# Patient Record
Sex: Female | Born: 1995 | Race: Black or African American | Hispanic: No | Marital: Married
Health system: Midwestern US, Community
[De-identification: ages and names within clinical notes are randomized; demographics above are authoritative.]

## PROBLEM LIST (undated history)

## (undated) DIAGNOSIS — F419 Anxiety disorder, unspecified: Secondary | ICD-10-CM

## (undated) DIAGNOSIS — G43909 Migraine, unspecified, not intractable, without status migrainosus: Secondary | ICD-10-CM

## (undated) DIAGNOSIS — F410 Panic disorder [episodic paroxysmal anxiety] without agoraphobia: Secondary | ICD-10-CM

## (undated) HISTORY — PX: TUBAL LIGATION: SHX77

## (undated) HISTORY — PX: ADENOIDECTOMY: SUR15

## (undated) HISTORY — PX: WISDOM TOOTH EXTRACTION: SHX21

---
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
## 2014-11-30 ENCOUNTER — Ambulatory Visit
Admit: 2014-11-30 | Discharge: 2014-11-30 | Payer: PRIVATE HEALTH INSURANCE | Attending: Internal Medicine | Primary: Internal Medicine

## 2014-11-30 DIAGNOSIS — F410 Panic disorder [episodic paroxysmal anxiety] without agoraphobia: Secondary | ICD-10-CM

## 2014-11-30 MED ORDER — HYDROXYZINE PAMOATE 25 MG CAP
25 mg | ORAL_CAPSULE | Freq: Three times a day (TID) | ORAL | 2 refills | Status: DC | PRN
Start: 2014-11-30 — End: 2014-12-03

## 2014-11-30 MED ORDER — CITALOPRAM 10 MG TAB
10 mg | ORAL_TABLET | Freq: Every day | ORAL | 2 refills | Status: DC
Start: 2014-11-30 — End: 2014-12-03

## 2014-11-30 NOTE — Progress Notes (Signed)
History of Present Illness  Kelsey Huff is a 19 y.o. female who presents today for management of    Chief Complaint   Patient presents with   ??? Establish Care   ??? Anxiety       Patient is here to establish care. She moved from [place] one year ago.  Patient states that she was on anxiety medication (Valium ) in the past but was discontinued by her previous PCP.  Recently, she had a family issue which precipitated her anxiety. She states that this is temporary. Last week, she had an episode of SOB, chest pain and sweating. She was brought to the [facility]. She denies any depression, anhedonia, sleeping problems, appetite issues.       Past Medical History  Past Medical History   Diagnosis Date   ??? Anxiety         Surgical History  History reviewed. No pertinent past surgical history.     Current Medications  Current Outpatient Prescriptions   Medication Sig Dispense   ??? norgestimate-ethinyl estradiol (ORTHO TRI-CYCLEN LO) 0.18/0.215/0.25 mg-25 mcg tab TAKE 1 TABLET DAILY    ??? valACYclovir (VALTREX) 1 gram tablet TAKE 1 TABLET BY MOUTH DAILY.    ??? citalopram (CELEXA) 10 mg tablet Take 1 Tab by mouth daily. Indications: PANIC DISORDER 30 Tab   ??? hydrOXYzine (VISTARIL) 25 mg capsule Take 1 Cap by mouth every eight (8) hours as needed for Anxiety. 30 Cap     No current facility-administered medications for this visit.        Allergies/Drug Reactions  No Known Allergies     Family History  Family History   Problem Relation Age of Onset   ??? Heart Disease Mother    ??? No Known Problems Father    ??? Hypertension Maternal Grandmother    ??? Hypertension Maternal Grandfather         Social History  Social History     Social History   ??? Marital status: MARRIED     Spouse name: N/A   ??? Number of children: N/A   ??? Years of education: N/A     Occupational History   ??? Not on file.     Social History Main Topics   ??? Smoking status: Never Smoker   ??? Smokeless tobacco: Never Used   ??? Alcohol use No   ??? Drug use: No    ??? Sexual activity: Yes     Partners: Female, Female     Birth control/ protection: Pill     Other Topics Concern   ??? Not on file     Social History Narrative   ??? No narrative on file       Health Maintenance   Topic Date Due   ??? Hepatitis A Peds Age 63-18 (1 of 2 - Standard Series) 05/29/1995   ??? DTaP/Tdap/Td series (1 - Tdap) 05/28/2001   ??? HPV AGE 22Y-26Y (1 of 3 - Female 3 Dose Series) 05/28/2005   ??? INFLUENZA AGE 22 TO ADULT  11/19/2014       There is no immunization history on file for this patient.    Review of Systems  General ROS: negative for - chills, fatigue or fever  Psychological ROS: positive for - anxiety  Ophthalmic ROS: positive for - uses glasses  ENT ROS: negative  Allergy and Immunology ROS: negative  Respiratory ROS: no cough, shortness of breath, or wheezing  Cardiovascular ROS: no chest pain or dyspnea on exertion  Gastrointestinal ROS: no abdominal pain, change in bowel habits, or black or bloody stools  Genito-Urinary ROS: no dysuria, trouble voiding, or hematuria  Musculoskeletal ROS: negative  Neurological ROS: positive for - headaches    Physical Exam  Vital signs:   Vitals:    11/30/14 1140   BP: 109/66   Pulse: 70   Resp: 14   Temp: 98.1 ??F (36.7 ??C)   TempSrc: Oral   SpO2: 99%   Weight: 148 lb (67.1 kg)   Height: 5\' 4"  (1.626 m)       General: alert, oriented, not in distress  Head: scalp normal, atraumatic  Eyes: pupils are equal and reactive, full and intact EOM's  Ears: patent ear canal, intact tympanic membrane  Nose: normal turbinates, no congestion or discharge  Lips/Mouth: moist lips and buccal mucosa, non-enlarged tonsils, pink throat  Neck: supple, no JVD, no lymphadenopathy, non-palpable thyroid  Chest/Lungs: clear breath sounds, no wheezing or crackles  Heart: normal rate, regular rhythm, no murmur  Abdomen: soft, non-distended, non-tender, normal bowel sounds, no organomegaly, no masses  Extremities: no focal deformities, no edema  Skin: no active skin lesions     Assessment/Plan:        ICD-10-CM ICD-9-CM    1. Panic disorder F41.0 300.01 citalopram (CELEXA) 10 mg tablet      hydrOXYzine (VISTARIL) 25 mg capsule       Follow-up Disposition:  Return in about 4 weeks (around 12/28/2014) for rov.      I have discussed the diagnosis with the patient and the intended plan as seen in the above orders.  The patient has received an after-visit summary and questions were answered concerning future plans.  I have discussed medication side effects and warnings with the patient as well. I have reviewed the plan of care with the patient, accepted their input and they are in agreement with the treatment goals.       Arneta Cliche, MD  November 30, 2014

## 2014-11-30 NOTE — Progress Notes (Signed)
Chief Complaint   Patient presents with   ??? Establish Care   ??? Anxiety

## 2014-11-30 NOTE — Patient Instructions (Signed)
Panic Attacks: Care Instructions  Your Care Instructions  During a panic attack, you may have a feeling of intense fear or terror, trouble breathing, chest pain or tightness, heartbeat changes, dizziness, sweating, and shaking. A panic attack starts suddenly and usually lasts from 5 to 20 minutes but may last even longer. You have the most anxiety about 10 minutes after the attack starts. An attack can begin with a stressful event, or it can happen without a cause.  Although panic attacks can cause scary symptoms, you can learn to manage them with self-care, counseling, and medicine.  Follow-up care is a key part of your treatment and safety. Be sure to make and go to all appointments, and call your doctor if you are having problems. It's also a good idea to know your test results and keep a list of the medicines you take.  How can you care for yourself at home?  ?? Take your medicine exactly as directed. Call your doctor if you think you are having a problem with your medicine.  ?? Go to your counseling sessions and follow-up appointments.  ?? Recognize and accept your anxiety. Then, when you are in a situation that makes you anxious, say to yourself, "This is not an emergency. I feel uncomfortable, but I am not in danger. I can keep going even if I feel anxious."  ?? Be kind to your body:  ?? Relieve tension with exercise or a massage.  ?? Get enough rest.  ?? Avoid alcohol, caffeine, nicotine, and illegal drugs. They can increase your anxiety level, cause sleep problems, or trigger a panic attack.  ?? Learn and do relaxation techniques. See below for more about these techniques.  ?? Engage your mind. Get out and do something you enjoy. Go to a funny movie, or take a walk or hike. Plan your day. Having too much or too little to do can make you anxious.  ?? Keep a record of your symptoms. Discuss your fears with a good friend or family member, or join a support group for people with similar problems.  Talking to others sometimes relieves stress.  ?? Get involved in social groups, or volunteer to help others. Being alone sometimes makes things seem worse than they are.  ?? Get at least 30 minutes of exercise on most days of the week to relieve stress. Walking is a good choice. You also may want to do other activities, such as running, swimming, cycling, or playing tennis or team sports.  Relaxation techniques  Do relaxation exercises for 10 to 20 minutes a day. You can play soothing, relaxing music while you do them, if you wish.  ?? Tell others in your house that you are going to do your relaxation exercises. Ask them not to disturb you.  ?? Find a comfortable place, away from all distractions and noise.  ?? Lie down on your back, or sit with your back straight.  ?? Focus on your breathing. Make it slow and steady.  ?? Breathe in through your nose. Breathe out through either your nose or mouth.  ?? Breathe deeply, filling up the area between your navel and your rib cage. Breathe so that your belly goes up and down.  ?? Do not hold your breath.  ?? Breathe like this for 5 to 10 minutes. Notice the feeling of calmness throughout your whole body.  As you continue to breathe slowly and deeply, relax by doing the following for another 5 to 10 minutes:  ??   Tighten and relax each muscle group in your body. You can begin at your toes and work your way up to your head.  ?? Imagine your muscle groups relaxing and becoming heavy.  ?? Empty your mind of all thoughts.  ?? Let yourself relax more and more deeply.  ?? Become aware of the state of calmness that surrounds you.  ?? When your relaxation time is over, you can bring yourself back to alertness by moving your fingers and toes and then your hands and feet and then stretching and moving your entire body. Sometimes people fall asleep during relaxation, but they usually wake up shortly afterward.  ?? Always give yourself time to return to full alertness before you drive a  car or do anything that might cause an accident if you are not fully alert. Never play a relaxation tape while driving a car.  When should you call for help?  Call 911 anytime you think you may need emergency care. For example, call if:  ?? You feel you cannot stop from hurting yourself or someone else.  Watch closely for changes in your health, and be sure to contact your doctor if:  ?? Your panic attacks get worse.  ?? You have new or different anxiety.  ?? You are not getting better as expected.  Where can you learn more?  Go to http://www.healthwise.net/GoodHelpConnections  Enter H601 in the search box to learn more about "Panic Attacks: Care Instructions."  ?? 2006-2016 [employer]. Care instructions adapted under license by [employer] (which disclaims liability or warranty for this information). This care instruction is for use with your licensed healthcare professional. If you have questions about a medical condition or this instruction, always ask your healthcare professional. Healthwise, Incorporated disclaims any warranty or liability for your use of this information.  Content Version: 10.9.538570; Current as of: March 09, 2014

## 2014-12-03 ENCOUNTER — Encounter

## 2014-12-03 MED ORDER — DIAZEPAM 2 MG TAB
2 mg | ORAL_TABLET | Freq: Three times a day (TID) | ORAL | 0 refills | Status: AC | PRN
Start: 2014-12-03 — End: ?

## 2014-12-03 NOTE — Progress Notes (Signed)
Patient reports of dizziness and nausea after starting hydroxyzine and citalopram. She used to take diazepam as needed for panic attacks.  Plan:  Stop hydroxyzine and citalopram  Refer to Psych  Given prescription for Diazepam 2mg  Q8 PRN.

## 2014-12-03 NOTE — Telephone Encounter (Signed)
Pt would like a call in ref to new medication Hydroxyzine, Citalopram. May be causing reaction to birth control making cycle differ and very heavy, dizziness, nausea. Please assist.

## 2014-12-03 NOTE — Telephone Encounter (Signed)
I called and spoke with the patient. I informed her that the provider would like to know if she wants to take Paxil instead of the citaloprtaml. The patient declined and stated that she does not want to take Paxil nor citalopram. She stated that she took diazepam 5mg  as needed in the past and would prefer to restart the medication. I informed the provider of this and she stated that she will provide the patient with a limited supply of diazepam and refer the patient to psych. I made the patient aware of the provider's decision. She then voiced concerns about seeing a psychiatrist, stating that she does not want it to affect her job or her school. 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 I informed the patient that her school can not discriminate against her for seeing a psychiatrist as it relates to her health. And I also informed her that I will relay her concerns to the provider. I informed the provider of the patient's concerns.

## 2014-12-28 ENCOUNTER — Encounter: Attending: Internal Medicine | Primary: Internal Medicine

## 2015-01-01 NOTE — Progress Notes (Signed)
Pt was a No Show 12/28/14. Letter #1 sent 01/01/15/mb.

## 2015-01-07 ENCOUNTER — Encounter: Attending: Internal Medicine | Primary: Internal Medicine

## 2015-01-08 NOTE — Progress Notes (Signed)
Pt was a No Show 01/07/15.Letter #2 sent 01/08/15. DMA/mb

## 2015-05-08 ENCOUNTER — Encounter: Attending: Internal Medicine | Primary: Internal Medicine

## 2015-05-08 ENCOUNTER — Inpatient Hospital Stay: Admit: 2015-05-08 | Payer: TRICARE (CHAMPUS) | Primary: Internal Medicine

## 2015-05-08 ENCOUNTER — Ambulatory Visit
Admit: 2015-05-08 | Discharge: 2015-05-08 | Payer: PRIVATE HEALTH INSURANCE | Attending: Internal Medicine | Primary: Internal Medicine

## 2015-05-08 DIAGNOSIS — R1011 Right upper quadrant pain: Secondary | ICD-10-CM

## 2015-05-08 LAB — METABOLIC PANEL, COMPREHENSIVE
A-G Ratio: 1.3 (ref 0.8–1.7)
ALT (SGPT): 16 U/L (ref 13–56)
AST (SGOT): 9 U/L — ABNORMAL LOW (ref 15–37)
Albumin: 4 g/dL (ref 3.4–5.0)
Alk. phosphatase: 103 U/L (ref 45–117)
Anion gap: 7 mmol/L (ref 3.0–18)
BUN/Creatinine ratio: 15 (ref 12–20)
BUN: 11 MG/DL (ref 7.0–18)
Bilirubin, total: 0.2 MG/DL (ref 0.2–1.0)
CO2: 29 mmol/L (ref 21–32)
Calcium: 9.2 MG/DL (ref 8.5–10.1)
Chloride: 100 mmol/L (ref 100–108)
Creatinine: 0.71 MG/DL (ref 0.6–1.3)
GFR est AA: >60 mL/min/{1.73_m2} (ref >60)
GFR est non-AA: >60 mL/min/{1.73_m2} (ref >60)
Globulin: 3 g/dL (ref 2.0–4.0)
Glucose: 78 mg/dL (ref 74–99)
Potassium: 4.2 mmol/L (ref 3.5–5.5)
Protein, total: 7 g/dL (ref 6.4–8.2)
Sodium: 136 mmol/L (ref 136–145)

## 2015-05-08 LAB — CBC WITH AUTOMATED DIFF
ABS. BASOPHILS: 0 10*3/uL (ref 0.0–0.06)
ABS. EOSINOPHILS: 0.1 10*3/uL (ref 0.0–0.4)
ABS. LYMPHOCYTES: 2.3 10*3/uL (ref 0.9–3.6)
ABS. MONOCYTES: 0.5 10*3/uL (ref 0.05–1.2)
ABS. NEUTROPHILS: 3.9 10*3/uL (ref 1.8–8.0)
BASOPHILS: 0 % (ref 0–2)
EOSINOPHILS: 2 % (ref 0–5)
HCT: 39.3 % (ref 35.0–45.0)
HGB: 12.9 g/dL (ref 12.0–16.0)
LYMPHOCYTES: 34 % (ref 21–52)
MCH: 29.1 PG (ref 24.0–34.0)
MCHC: 32.8 g/dL (ref 31.0–37.0)
MCV: 88.5 FL (ref 74.0–97.0)
MONOCYTES: 7 % (ref 3–10)
MPV: 9.1 FL — ABNORMAL LOW (ref 9.2–11.8)
NEUTROPHILS: 57 % (ref 40–73)
PLATELET: 421 10*3/uL — ABNORMAL HIGH (ref 135–420)
RBC: 4.44 M/uL (ref 4.20–5.30)
RDW: 12.4 % (ref 11.6–14.5)
WBC: 6.7 10*3/uL (ref 4.6–13.2)

## 2015-05-08 LAB — LIPASE: Lipase: 178 U/L (ref 73–393)

## 2015-05-08 MED ORDER — NAPROXEN 500 MG TAB
500 mg | ORAL_TABLET | Freq: Two times a day (BID) | ORAL | 0 refills | Status: AC
Start: 2015-05-08 — End: ?

## 2015-05-08 NOTE — Patient Instructions (Signed)
Abdominal Pain: Care Instructions  Your Care Instructions    Abdominal pain has many possible causes. Some aren't serious and get better on their own in a few days. Others need more testing and treatment. If your pain continues or gets worse, you need to be rechecked and may need more tests to find out what is wrong. You may need surgery to correct the problem.  Don't ignore new symptoms, such as fever, nausea and vomiting, urination problems, pain that gets worse, and dizziness. These may be signs of a more serious problem.  Your doctor may have recommended a follow-up visit in the next 8 to 12 hours. If you are not getting better, you may need more tests or treatment.  The doctor has checked you carefully, but problems can develop later. If you notice any problems or new symptoms, get medical treatment right away.  Follow-up care is a key part of your treatment and safety. Be sure to make and go to all appointments, and call your doctor if you are having problems. It's also a good idea to know your test results and keep a list of the medicines you take.  How can you care for yourself at home?  ?? Rest until you feel better.  ?? To prevent dehydration, drink plenty of fluids, enough so that your urine is light yellow or clear like water. Choose water and other caffeine-free clear liquids until you feel better. If you have kidney, heart, or liver disease and have to limit fluids, talk with your doctor before you increase the amount of fluids you drink.  ?? If your stomach is upset, eat mild foods, such as rice, dry toast or crackers, bananas, and applesauce. Try eating several small meals instead of two or three large ones.  ?? Wait until 48 hours after all symptoms have gone away before you have spicy foods, alcohol, and drinks that contain caffeine.  ?? Do not eat foods that are high in fat.  ?? Avoid anti-inflammatory medicines such as aspirin, ibuprofen (Advil,  Motrin), and naproxen (Aleve). These can cause stomach upset. Talk to your doctor if you take daily aspirin for another health problem.  When should you call for help?  Call 911 anytime you think you may need emergency care. For example, call if:  ?? You passed out (lost consciousness).  ?? You pass maroon or very bloody stools.  ?? You vomit blood or what looks like coffee grounds.  ?? You have new, severe belly pain.  Call your doctor now or seek immediate medical care if:  ?? Your pain gets worse, especially if it becomes focused in one area of your belly.  ?? You have a new or higher fever.  ?? Your stools are black and look like tar, or they have streaks of blood.  ?? You have unexpected vaginal bleeding.  ?? You have symptoms of a urinary tract infection. These may include:  ?? Pain when you urinate.  ?? Urinating more often than usual.  ?? Blood in your urine.  ?? You are dizzy or lightheaded, or you feel like you may faint.  Watch closely for changes in your health, and be sure to contact your doctor if:  ?? You are not getting better after 1 day (24 hours).  Where can you learn more?  Go to http://www.healthwise.net/GoodHelpConnections.  Enter E907 in the search box to learn more about "Abdominal Pain: Care Instructions."  Current as of: Sep 14, 2014  Content Version: 11.1  ??   2006-2016 [employer], [employer]. Care instructions adapted under license by [employer] (which disclaims liability or warranty for this information). If you have questions about a medical condition or this instruction, always ask your healthcare professional. [employer]wise, [employer] disclaims any warranty or liability for your use of this information.

## 2015-05-08 NOTE — Progress Notes (Signed)
Patient presents to clinic for follow up on ER visit for abdominal pain.     Advance Directive:    1. Do you have an advance directive in place?  Patient Reply:     2. If not, would you like material regarding how to put one in place?  Patient Reply:      Coordination of Care:    1. Have you been to the ER, urgent care clinic since your last visit?  Hospitalized since your last visit? [facility] for abdominal pain on  05/05/15     2. Have you seen or consulted any other health care providers outside of the [facility] since your last visit?  Include any pap smears or colon screening. No     Depression Screening completed.      Learning Assessment completed.     Abuse Screening completed.     Health Maintenance reviewed and discussed per provider.

## 2015-05-08 NOTE — Progress Notes (Signed)
History of Present Illness  Kelsey Huff is a 19 y.o. female who presents today for management of    Chief Complaint   Patient presents with   ??? ED Follow-up   ??? Abdominal Pain       Patient complains of right upper quadrant pain radiating to the epigastric and periumbilical area. The pain started 3 days ago, sudden in onset. Pain intensity 8/10. She had one episode of vomiting with fresh blood which prompted her to go to [facility] ER. She was discharged home with famotidine, sucralfate and Zofran.  At present, she complains of constant pain, sharp in character, 8/10 in severity. No further vomiting or hematamesis. No diarrhea, dark or bloody stool, fever, chills, urinary symptoms.    LMP 04/09/15    Problem List  There are no active problems to display for this patient.      Current Medications  Current Outpatient Prescriptions   Medication Sig Dispense   ??? sucralfate (CARAFATE) 1 gram tablet Take 1 g by mouth four (4) times daily.    ??? famotidine (PEPCID) 20 mg tablet Take 20 mg by mouth two (2) times a day.    ??? ondansetron (ZOFRAN ODT) 8 mg disintegrating tablet Take 8 mg by mouth every six (6) hours as needed for Nausea.    ??? polyethylene glycol (MIRALAX) 17 gram packet Take 17 g by mouth daily.    ??? naproxen (NAPROSYN) 500 mg tablet Take 1 Tab by mouth two (2) times daily (with meals). As needed for pain  Indications: PAIN 100 Tab   ??? diazepam (VALIUM) 2 mg tablet Take 1 Tab by mouth every eight (8) hours as needed for Anxiety. Max Daily Amount: 6 mg. Indications: PANIC DISORDER 20 Tab   ??? norgestimate-ethinyl estradiol (ORTHO TRI-CYCLEN LO) 0.18/0.215/0.25 mg-25 mcg tab TAKE 1 TABLET DAILY    ??? valACYclovir (VALTREX) 1 gram tablet TAKE 1 TABLET BY MOUTH DAILY.      No current facility-administered medications for this visit.        Allergies/Drug Reactions  No Known Allergies     Review of Systems  General ROS: negative for - chills, fatigue or fever   Psychological ROS: positive for - anxiety  Gastrointestinal ROS: positive for - abdominal pain  negative for - blood in stools, change in bowel habits, change in stools, diarrhea, gas/bloating, melena or nausea/vomiting  Genito-Urinary ROS: no dysuria, trouble voiding, or hematuria  Neurological ROS: negative      Physical Exam  Vital signs:   Vitals:    05/08/15 0938   BP: 125/69   Pulse: 79   Resp: 18   Temp: 97.5 ??F (36.4 ??C)   TempSrc: Oral   SpO2: 99%   Weight: 153 lb (69.4 kg)   Height:  (1.626 m)       General: alert, oriented, not in distress  Abdomen: soft, non-distended, mild tenderness over RUQ and epigastric areas, no guarding or rigidity, negative Murphy's sign, normal bowel sounds, no organomegaly, no masses  Extremities: no focal deformities, no edema    Assessment/Plan:      ICD-10-CM ICD-9-CM    1. Right upper quadrant pain R10.11 789.01 CBC WITH AUTOMATED DIFF      METABOLIC PANEL, COMPREHENSIVE      LIPASE      [place] ABD LTD      naproxen (NAPROSYN) 500 mg tablet     RUQ pain - non acute abdomen. DDx: Gallstone vs Gastritis vs Pancreatitis  Follow-up Disposition:  Return if symptoms worsen or fail to improve.      I have discussed the diagnosis with the patient and the intended plan as seen in the above orders.  The patient has received an after-visit summary and questions were answered concerning future plans.  I have discussed medication side effects and warnings with the patient as well. I have reviewed the plan of care with the patient, accepted their input and they are in agreement with the treatment goals.       Arneta Cliche, MD  May 08, 2015

## 2015-05-13 ENCOUNTER — Ambulatory Visit
Admit: 2015-05-13 | Discharge: 2015-05-13 | Payer: PRIVATE HEALTH INSURANCE | Attending: Internal Medicine | Primary: Internal Medicine

## 2015-05-13 DIAGNOSIS — R110 Nausea: Secondary | ICD-10-CM

## 2015-05-13 LAB — AMB POC URINE PREGNANCY TEST, VISUAL COLOR COMPARISON: HCG urine, Ql. (POC): NEGATIVE

## 2015-05-13 MED ORDER — SIMETHICONE 125 MG CAP
125 mg | ORAL_CAPSULE | Freq: Four times a day (QID) | ORAL | 0 refills | Status: AC | PRN
Start: 2015-05-13 — End: ?

## 2015-05-13 MED ORDER — OMEPRAZOLE 40 MG CAP, DELAYED RELEASE
40 mg | ORAL_CAPSULE | Freq: Every day | ORAL | 2 refills | Status: AC
Start: 2015-05-13 — End: ?

## 2015-05-13 NOTE — Progress Notes (Signed)
1. Have you been to the ER, urgent care clinic since your last visit?  Hospitalized since your last visit?No    2. Have you seen or consulted any other health care providers outside of the [facility] since your last visit?  Include any pap smears or colon screening. No

## 2015-05-13 NOTE — Progress Notes (Signed)
History of Present Illness  Kelsey Huff is a 20 y.o. female who presents today for management of    Chief Complaint   Patient presents with   ??? Follow-up   ??? Abdominal Pain       Patient complains of persistent generalized abdominal discomfort associated with sudden bursts of nausea. She also complains of bloatedness and increased gas. No vomiting, hematamesis, abdominal distention, diarrhea and constipation, urinary symptoms. She has good appetite. No change in her diet. She is passing gas. She currently takes sucralfate, famotidine and Zofran. Lipase level normal.    Problem List  Patient Active Problem List    Diagnosis Date Noted   ??? Panic attacks 05/08/2015       Current Medications  Current Outpatient Prescriptions   Medication Sig Dispense   ??? omeprazole (PRILOSEC) 40 mg capsule Take 1 Cap by mouth daily. 30 minutes before breakfast 30 Cap   ??? simethicone (GAS-X) 125 mg capsule Take 1 Cap by mouth four (4) times daily as needed. 100 Cap   ??? ondansetron (ZOFRAN ODT) 8 mg disintegrating tablet Take 8 mg by mouth every six (6) hours as needed for Nausea.    ??? polyethylene glycol (MIRALAX) 17 gram packet Take 17 g by mouth daily.    ??? naproxen (NAPROSYN) 500 mg tablet Take 1 Tab by mouth two (2) times daily (with meals). As needed for pain  Indications: PAIN 100 Tab   ??? diazepam (VALIUM) 2 mg tablet Take 1 Tab by mouth every eight (8) hours as needed for Anxiety. Max Daily Amount: 6 mg. Indications: PANIC DISORDER 20 Tab   ??? norgestimate-ethinyl estradiol (ORTHO TRI-CYCLEN LO) 0.18/0.215/0.25 mg-25 mcg tab TAKE 1 TABLET DAILY    ??? valACYclovir (VALTREX) 1 gram tablet TAKE 1 TABLET BY MOUTH DAILY.      No current facility-administered medications for this visit.        Allergies/Drug Reactions  No Known Allergies     Review of Systems  General ROS: negative for - chills, fatigue or fever  Respiratory ROS: no cough, shortness of breath, or wheezing  Cardiovascular ROS: no chest pain or dyspnea on exertion   Gastrointestinal ROS: positive for - abdominal pain, gas/bloating and nausea/vomiting  negative for - appetite loss, blood in stools, change in bowel habits, heartburn or melena  Genito-Urinary ROS: no dysuria, trouble voiding, or hematuria  Musculoskeletal ROS: negative  Neurological ROS: negative      Physical Exam  Vital signs:   Vitals:    05/13/15 1123   BP: 109/49   Pulse: 75   Resp: 16   Temp: 97.8 ??F (36.6 ??C)   TempSrc: Oral   SpO2: 98%   Weight: 155 lb (70.3 kg)   Height: '5\' 4"'  (1.626 m)       General: alert, oriented, not in distress  Chest/Lungs: clear breath sounds, no wheezing or crackles  Heart: normal rate, regular rhythm, no murmur  Abdomen: soft, non-distended, mild tenderness over epigastric and RUQ areas, hyperactive bowel sounds, no organomegaly, no masses  Extremities: no focal deformities, no edema    Laboratory/Tests:  POC Pregnancy Test    Component      Latest Ref Rng & Units 05/08/2015 05/08/2015 05/08/2015          10:10 AM 10:10 AM 10:10 AM   WBC      4.6 - 13.2 K/uL   6.7   RBC      4.20 - 5.30 M/uL   4.44   HGB  12.0 - 16.0 g/dL   12.9   HCT      35.0 - 45.0 %   39.3   MCV      74.0 - 97.0 FL   88.5   MCH      24.0 - 34.0 PG   29.1   MCHC      31.0 - 37.0 g/dL   32.8   RDW      11.6 - 14.5 %   12.4   PLATELET      135 - 420 K/uL   421 (H)   MPV      9.2 - 11.8 FL   9.1 (L)   NEUTROPHILS      40 - 73 %   57   LYMPHOCYTES      21 - 52 %   34   MONOCYTES      3 - 10 %   7   EOSINOPHILS      0 - 5 %   2   BASOPHILS      0 - 2 %   0   ABS. NEUTROPHILS      1.8 - 8.0 K/UL   3.9   ABS. LYMPHOCYTES      0.9 - 3.6 K/UL   2.3   ABS. MONOCYTES      0.05 - 1.2 K/UL   0.5   ABS. EOSINOPHILS      0.0 - 0.4 K/UL   0.1   ABS. BASOPHILS      0.0 - 0.06 K/UL   0.0   DF         AUTOMATED   Sodium      136 - 145 mmol/L  136    Potassium      3.5 - 5.5 mmol/L  4.2    Chloride      100 - 108 mmol/L  100    CO2      21 - 32 mmol/L  29    Anion gap      3.0 - 18 mmol/L  7    Glucose      74 - 99 mg/dL  78     BUN      7.0 - 18 MG/DL  11    Creatinine      0.6 - 1.3 MG/DL  0.71    BUN/Creatinine ratio      12 - 20    15    GFR est AA      >60 ml/min/1.70m  >60    GFR est non-AA      >60 ml/min/1.781m >60    Calcium      8.5 - 10.1 MG/DL  9.2    Bilirubin, total      0.2 - 1.0 MG/DL  0.2    ALT      13 - 56 U/L  16    AST      15 - 37 U/L  9 (L)    Alk. phosphatase      45 - 117 U/L  103    Protein, total      6.4 - 8.2 g/dL  7.0    Albumin      3.4 - 5.0 g/dL  4.0    Globulin      2.0 - 4.0 g/dL  3.0    A-G Ratio      0.8 - 1.7    1.3    Lipase      73 -  393 U/L 178         Assessment/Plan:      ICD-10-CM ICD-9-CM    1. Nausea R11.0 787.02 AMB POC URINE PREGNANCY TEST, VISUAL COLOR COMPARISON      omeprazole (PRILOSEC) 40 mg capsule   2. Abdominal bloating R14.0 787.3 omeprazole (PRILOSEC) 40 mg capsule      simethicone (GAS-X) 125 mg capsule     Non-acute abdomen, Pregnancy test negative  DDX: Gastritis vs medication side effect (carafate, famotidine) vs IBS vs malabsorption (less likely with no h/o diarrhea)    Plan:  Stop famotidine and sucralfate  Start omeprazole  Start simethicone as needed  Continue Zofran as needed  Check [place] right upper quadrant (scheduled on 05/16/15)      Follow-up Disposition:  Return if symptoms worsen or fail to improve.      I have discussed the diagnosis with the patient and the intended plan as seen in the above orders.  The patient has received an after-visit summary and questions were answered concerning future plans.  I have discussed medication side effects and warnings with the patient as well. I have reviewed the plan of care with the patient, accepted their input and they are in agreement with the treatment goals.       Karen Kays, MD  May 13, 2015

## 2015-05-16 ENCOUNTER — Inpatient Hospital Stay: Admit: 2015-05-16 | Payer: TRICARE (CHAMPUS) | Attending: Internal Medicine | Primary: Internal Medicine

## 2015-05-16 DIAGNOSIS — R1011 Right upper quadrant pain: Secondary | ICD-10-CM

## 2015-05-16 NOTE — Telephone Encounter (Signed)
-----   Message from Arneta Cliche, MD sent at 05/16/2015  9:10 AM EST -----  Please inform pt that ultrasound is normal  Thank you

## 2015-05-16 NOTE — Telephone Encounter (Signed)
Patient was called in regards to lab results. Patient educated as per instructed by Dr. Bumanglag. Patient verbalized understanding.

## 2016-06-28 ENCOUNTER — Emergency Department (HOSPITAL_COMMUNITY)
Admission: EM | Admit: 2016-06-28 | Discharge: 2016-06-28 | Disposition: A | Discharge status: Home / Self Care | Payer: Self-pay | Attending: Physician Assistant | Admitting: Physician Assistant

## 2016-06-28 ENCOUNTER — Emergency Department (HOSPITAL_COMMUNITY): Payer: Self-pay

## 2016-06-28 DIAGNOSIS — R0789 Other chest pain: Secondary | ICD-10-CM | POA: Insufficient documentation

## 2016-06-28 LAB — BASIC METABOLIC PANEL
Anion gap: 10 (ref 5–15)
BUN: 11 mg/dL (ref 6–20)
CALCIUM: 9.2 mg/dL (ref 8.9–10.3)
CO2: 25 mmol/L (ref 22–32)
CREATININE: 0.68 mg/dL (ref 0.44–1.00)
Chloride: 101 mmol/L (ref 101–111)
GFR calc Af Amer: >60 mL/min (ref >60)
GLUCOSE: 86 mg/dL (ref 65–99)
Potassium: 3.8 mmol/L (ref 3.5–5.1)
Sodium: 136 mmol/L (ref 135–145)

## 2016-06-28 LAB — CBC
HCT: 37.7 % (ref 36.0–46.0)
Hemoglobin: 12 g/dL (ref 12.0–15.0)
MCH: 27.1 pg (ref 26.0–34.0)
MCHC: 31.8 g/dL (ref 30.0–36.0)
MCV: 85.3 fL (ref 78.0–100.0)
PLATELETS: 379 10*3/uL (ref 150–400)
RBC: 4.42 MIL/uL (ref 3.87–5.11)
RDW: 15.4 % (ref 11.5–15.5)
WBC: 6.4 10*3/uL (ref 4.0–10.5)

## 2016-06-28 LAB — I-STAT TROPONIN, ED: Troponin i, poc: 0 ng/mL (ref 0.00–0.08)

## 2016-06-28 MED ORDER — HYDROXYZINE HCL 25 MG PO TABS: 25.0000 mg | ORAL_TABLET | Freq: Four times a day (QID) | ORAL | 0 refills | Status: DC

## 2016-06-28 MED ORDER — IBUPROFEN 400 MG PO TABS
600.0000 mg | ORAL_TABLET | Freq: Once | ORAL | Status: AC
  Administered 2016-06-28: 600 mg via ORAL
  Filled 2016-06-28: qty 1

## 2016-06-28 NOTE — Discharge Instructions (Signed)
Take hydroxyzine as needed for anxiety [facility]

## 2016-06-28 NOTE — ED Provider Notes (Signed)
[facility]EMER[facility]ENCY DEPT Provider Note   CSN: [id number] Arrival date & time: 06/28/16  1753     History   Chief Complaint Chief Complaint  Patient presents with  . Anxiety  . Chest Pain    HPI Kathleen Swanson is a 21 y.o. female who presents with chest pain. Past medical history significant for anxiety. She states she is from [place] and recently moved to the area. She also recently gave birth in December and pregnancy was overall uncomplicated. She has had intermittent chest pain for the past 2 days. It is similar with chest pain she's had in the past however is gone on longer than previous episodes. It is substernal and sometimes radiates to the left arm or the right side of the chest. Feels like a sharp stabbing. Nothing makes it better or worse. She reports associated "sweaty hands". She states she was previously on diazepam for anxiety and panic attacks however she is out of this medication for months due to her pregnancy. 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 She denies fever, chills, shortness of breath, cough, palpitations, abdominal pain, nausea, vomiting. No significant family history of heart disease.  HPI  No past medical history on file.  There are no active problems to display for this patient.   No past surgical history on file.  OB History    No data available       Home Medications    Prior to Admission medications   Not on File    Family History No family history on file.  Social History Social History  Substance Use Topics  . Smoking status: Not on file  . Smokeless tobacco: Not on file  . Alcohol use Not on file     Allergies   Patient has no allergy information on record.   Review of Systems Review of Systems  Constitutional: Negative for chills and fever.  Respiratory: Negative for cough and shortness of breath.   Cardiovascular: Positive for chest pain. Negative for palpitations and leg  swelling.  Gastrointestinal: Negative for abdominal pain, nausea and vomiting.  Psychiatric/Behavioral: The patient is nervous/anxious.   All other systems reviewed and are negative.    Physical Exam Updated Vital Signs BP 105/70   Pulse 66   Temp 97.7 F (36.5 C) (Oral)   Resp 11   Ht 5\' 4"  (1.626 m)   Wt 61.7 kg   LMP 06/29/2015 Comment: irregular periods (recent pregnancy)   SpO2 100%   BMI 23.34 kg/m   Physical Exam  Constitutional: She is oriented to person, place, and time. She appears well-developed and well-nourished. No distress.  HENT:  Head: Normocephalic and atraumatic.  Eyes: Conjunctivae are normal. Pupils are equal, round, and reactive to light. Right eye exhibits no discharge. Left eye exhibits no discharge. No scleral icterus.  Neck: Normal range of motion.  Cardiovascular: Normal rate and regular rhythm.  Exam reveals no gallop and no friction rub.   No murmur heard. Pulmonary/Chest: Effort normal and breath sounds normal. No respiratory distress. She has no wheezes. She has no rales. She exhibits no tenderness.  Abdominal: Soft. Bowel sounds are normal. She exhibits no distension and no mass. There is no tenderness. There is no rebound and no guarding. No hernia.  Neurological: She is alert and oriented to person, place, and time.  Skin: Skin is warm and dry.  Psychiatric: She has a normal mood and affect. Her behavior is normal.  Nursing note  and vitals reviewed.    ED Treatments / Results  Labs (all labs ordered are listed, but only abnormal results are displayed) Labs Reviewed  BASIC METABOLIC PANEL  CBC  I-STAT TROPOININ, ED    EKG  EKG Interpretation None       Radiology Dg Chest 2 View  Result Date: 06/28/2016 CLINICAL DATA:  21 year old female with left-sided chest pain for 2 days. EXAM: CHEST  2 VIEW COMPARISON:  None. FINDINGS: The cardiomediastinal silhouette is unremarkable. There is no evidence of focal airspace disease,  pulmonary edema, suspicious pulmonary nodule/mass, pleural effusion, or pneumothorax. No acute bony abnormalities are identified. Pectus excavatum deformity noted. IMPRESSION: No active cardiopulmonary disease. Electronically Signed   By: Harmon PierJeffrey  Hu M.D.   On: 06/28/2016 18:51    Procedures Procedures (including critical care time)  Medications Ordered in ED Medications  ibuprofen (ADVIL,MOTRIN) tablet 600 mg (600 mg Oral Given 06/28/16 1922)     Initial Impression / Assessment and Plan / ED Course  I have reviewed the triage vital signs and the nursing notes.  Pertinent labs & imaging results that were available during my care of the patient were reviewed by me and considered in my medical decision making (see chart for details).  21 year old female presents with atypical chest pain likely due to anxiety. Chest pain is similar to previous episodes and she has been off her medicines. Vitals are normal. Labs are normal. EKG is NSR. CXR is negative. She drove herself here so discussed I could not give her any sedating meds and she verbalized understanding. Will rx Hydroxyzine prn and [facility] given. Return precautions given.  Final Clinical Impressions(s) / ED Diagnoses   Final diagnoses:  Atypical chest pain    New Prescriptions New Prescriptions   No medications on file     Bethel BornKelly Marie Gekas, PA-C 06/28/16 2306    Courteney Lyn Mackuen, MD 06/29/16 1807

## 2016-06-28 NOTE — ED Triage Notes (Signed)
Pt. Stated, I have anxiety and Im not sure if that's the cause or not, I also have some chest pain, this all started 2 days ago.

## 2016-09-07 ENCOUNTER — Encounter (HOSPITAL_COMMUNITY): Payer: Self-pay | Admitting: Emergency Medicine

## 2016-09-07 ENCOUNTER — Emergency Department (HOSPITAL_COMMUNITY)
Admission: EM | Admit: 2016-09-07 | Discharge: 2016-09-07 | Disposition: A | Discharge status: Home / Self Care | Payer: 59 | Attending: Dermatology | Admitting: Dermatology

## 2016-09-07 DIAGNOSIS — Z5321 Procedure and treatment not carried out due to patient leaving prior to being seen by health care provider: Secondary | ICD-10-CM | POA: Diagnosis not present

## 2016-09-07 DIAGNOSIS — R51 Headache: Secondary | ICD-10-CM | POA: Insufficient documentation

## 2016-09-07 HISTORY — DX: Anxiety disorder, unspecified: F41.9

## 2016-09-07 NOTE — ED Notes (Signed)
Called pt to reassess vitals, had no answer.
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
## 2016-09-10 ENCOUNTER — Encounter (HOSPITAL_COMMUNITY): Payer: Self-pay

## 2016-09-10 ENCOUNTER — Emergency Department (HOSPITAL_COMMUNITY)
Admission: EM | Admit: 2016-09-10 | Discharge: 2016-09-10 | Disposition: A | Discharge status: Home / Self Care | Payer: 59 | Attending: Emergency Medicine | Admitting: Emergency Medicine

## 2016-09-10 DIAGNOSIS — Y9389 Activity, other specified: Secondary | ICD-10-CM | POA: Insufficient documentation

## 2016-09-10 DIAGNOSIS — S199XXA Unspecified injury of neck, initial encounter: Secondary | ICD-10-CM | POA: Diagnosis present

## 2016-09-10 DIAGNOSIS — Y999 Unspecified external cause status: Secondary | ICD-10-CM | POA: Diagnosis not present

## 2016-09-10 DIAGNOSIS — Z79899 Other long term (current) drug therapy: Secondary | ICD-10-CM | POA: Insufficient documentation

## 2016-09-10 DIAGNOSIS — S161XXA Strain of muscle, fascia and tendon at neck level, initial encounter: Secondary | ICD-10-CM | POA: Diagnosis not present

## 2016-09-10 DIAGNOSIS — Y92410 Unspecified street and highway as the place of occurrence of the external cause: Secondary | ICD-10-CM | POA: Insufficient documentation

## 2016-09-10 MED ORDER — METHOCARBAMOL 500 MG PO TABS: 500.0000 mg | ORAL_TABLET | Freq: Two times a day (BID) | ORAL | 0 refills | Status: DC

## 2016-09-10 MED ORDER — NAPROXEN 500 MG PO TABS: 500.0000 mg | ORAL_TABLET | Freq: Two times a day (BID) | ORAL | 0 refills | Status: DC
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
## 2016-09-10 NOTE — ED Notes (Signed)
ED Provider at bedside. 

## 2016-09-10 NOTE — ED Notes (Signed)
Pt verbalizes DC teaching and follow up with [facility]. NAD. VSS.

## 2016-09-10 NOTE — Discharge Instructions (Signed)
Take your medications as prescribed. You may also take Tylenol as prescribed over the counter as needed for additional relief. I also recommend applying ice and/or heat to affected area for 15-20 minutes 3-4 times daily for additional pain relief. Refrain from doing any heavy lifting, squatting or repetitive movements that exacerbate your symptoms. Follow-up with your primary care provider in the next week if her symptoms have not improved.  Please return to the Emergency Department if symptoms worsen or new onset of fever, neck stiffness, numbness, weakness, visual changes, chest pain, difficult breathing.
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
## 2019-06-25 ENCOUNTER — Encounter (HOSPITAL_COMMUNITY): Payer: Self-pay

## 2019-06-25 ENCOUNTER — Emergency Department (HOSPITAL_COMMUNITY)
Admission: EM | Admit: 2019-06-25 | Discharge: 2019-06-25 | Disposition: A | Discharge status: Home / Self Care | Payer: Medicaid Other | Attending: Emergency Medicine | Admitting: Emergency Medicine

## 2019-06-25 ENCOUNTER — Other Ambulatory Visit: Payer: Self-pay

## 2019-06-25 DIAGNOSIS — R079 Chest pain, unspecified: Secondary | ICD-10-CM | POA: Insufficient documentation

## 2019-06-25 DIAGNOSIS — Z79899 Other long term (current) drug therapy: Secondary | ICD-10-CM | POA: Diagnosis not present

## 2019-06-25 MED ORDER — LIDOCAINE VISCOUS HCL 2 % MT SOLN
15.0000 mL | Freq: Once | OROMUCOSAL | Status: AC
  Administered 2019-06-25: 15 mL via ORAL
  Filled 2019-06-25: qty 15

## 2019-06-25 MED ORDER — HYDROXYZINE HCL 25 MG PO TABS
25.0000 mg | ORAL_TABLET | Freq: Once | ORAL | Status: AC
  Administered 2019-06-25: 25 mg via ORAL
  Filled 2019-06-25: qty 1

## 2019-06-25 MED ORDER — ALUM & MAG HYDROXIDE-SIMETH 200-200-20 MG/5ML PO SUSP
30.0000 mL | Freq: Once | ORAL | Status: AC
  Administered 2019-06-25: 30 mL via ORAL
  Filled 2019-06-25: qty 30

## 2019-06-25 NOTE — ED Provider Notes (Signed)
[facility] DEPT Provider Note   CSN: [id number] Arrival date & time: 06/25/19  0145     History Chief Complaint  Patient presents with  . Chest Pain    Kathleen Swanson is a 24 y.o. female.   24 y/o female presents for chest pain and SOB onset tonight. Symptoms associated with radiation of the pain down BUE. No fevers, hemoptysis, syncope, use of OCPs. Patient believes her symptoms may be 2/2 anxiety. Has a hx of this and take PRN atarax, but does not have her medication with her. Was drinking alcohol tonight, but denies drug use and marijuana use. No prior cardiac hx.  Patient is a nonsmoker.  The history is provided by the patient. No language interpreter was used.  Chest Pain      Past Medical History:  Diagnosis Date  . Anxiety     There are no problems to display for this patient.   History reviewed. No pertinent surgical history.   OB History   No obstetric history on file.     History reviewed. No pertinent family history.  Social History   Tobacco Use  . Smoking status: Never Smoker  . Smokeless tobacco: Never Used  Substance Use Topics  . Alcohol use: No  . Drug use: No    Home Medications Prior to Admission medications   Medication Sig Start Date End Date Taking? Authorizing Provider  hydrOXYzine (ATARAX/VISTARIL) 25 MG tablet Take 1 tablet (25 mg total) by mouth every 6 (six) hours. 06/28/16   Recardo Evangelist, PA-C  methocarbamol (ROBAXIN) 500 MG tablet Take 1 tablet (500 mg total) by mouth 2 (two) times daily. 09/10/16   Nona Dell, PA-C  naproxen (NAPROSYN) 500 MG tablet Take 1 tablet (500 mg total) by mouth 2 (two) times daily. 09/10/16   Nona Dell, PA-C    Allergies    Patient has no known allergies.  Review of Systems   Review of Systems  Cardiovascular: Positive for chest pain.  Ten systems reviewed and are negative for acute change, except as noted in the HPI.    Physical  Exam Updated Vital Signs BP 114/63   Pulse 64   Temp 98.1 F (36.7 C) (Oral)   Resp 14   Ht 5\' 4"  (1.626 m)   Wt 59 kg   SpO2 95%   BMI 22.31 kg/m   Physical Exam Vitals and nursing note reviewed.  Constitutional:      General: She is not in acute distress.    Appearance: She is well-developed. She is not diaphoretic.     Comments: Nontoxic appearing and in NAD  HENT:     Head: Normocephalic and atraumatic.  Eyes:     General: No scleral icterus.    Conjunctiva/sclera: Conjunctivae normal.  Cardiovascular:     Rate and Rhythm: Normal rate and regular rhythm.     Pulses: Normal pulses.  Pulmonary:     Effort: Pulmonary effort is normal. No respiratory distress.     Breath sounds: No stridor. No wheezing, rhonchi or rales.     Comments: Lungs CTAB. Respirations even and unlabored. Musculoskeletal:        General: Normal range of motion.     Cervical back: Normal range of motion.  Skin:    General: Skin is warm and dry.     Coloration: Skin is not pale.     Findings: No erythema or rash.  Neurological:     Mental Status: She is alert  and oriented to person, place, and time.  Psychiatric:        Mood and Affect: Mood is anxious.        Behavior: Behavior normal.     ED Results / Procedures / Treatments   Labs (all labs ordered are listed, but only abnormal results are displayed) Labs Reviewed - No data to display  EKG EKG Interpretation  Date/Time:  Sunday June 25 2019 01:57:00 EST Ventricular Rate:  67 PR Interval:    QRS Duration: 95 QT Interval:  409 QTC Calculation: 432 R Axis:   50 Text Interpretation: Sinus arrhythmia Otherwise within normal limits When compared with ECG of 06/28/2016, No significant change was found Confirmed by Glick, David ([id number]) on 06/25/2019 2:00:18 AM   Radiology No results found.  Procedures Procedures (including critical care time)  Medications Ordered in ED Medications  alum & mag hydroxide-simeth (MAALOX/MYLANTA)  200-200-20 MG/5ML suspension 30 mL (has no administration in time range)    And  lidocaine (XYLOCAINE) 2 % viscous mouth solution 15 mL (has no administration in time range)  hydrOXYzine (ATARAX/VISTARIL) tablet 25 mg (25 mg Oral Given 06/25/19 0301)    ED Course  I have reviewed the triage vital signs and the nursing notes.  Pertinent labs & imaging results that were available during my care of the patient were reviewed by me and considered in my medical decision making (see chart for details).  Clinical Course as of Jun 25 431  Sun Jun 25, 2019  0433 Feeling a bit better after Atarax. GI cocktail ordered for residual symptoms. VSS. Plan for PCP follow up outpatient.   [KH]    Clinical Course User Index [KH] Lexianna Weinrich, PA-C   MDM Rules/Calculators/A&P                      24  year old female presents to the emergency department for complaints of chest pain and shortness of breath.  She does have a history of anxiety for which she takes Atarax, but did not have this medication with her tonight.  She was given Atarax in the ED with some improvement to her symptoms.  EKG on arrival noted to be reassuring.  The patient is PERC negative.  She has remained hemodynamically stable.  Low suspicion for emergent etiology of pain.  Return precautions discussed and provided. Patient discharged in stable condition with no unaddressed concerns.   Final Clinical Impression(s) / ED Diagnoses Final diagnoses:  Nonspecific chest pain    Rx / DC Orders ED Discharge Orders    None       , PA-C 06/25/19 0435    08/25/19, MD 06/25/19 [phone]

## 2019-06-25 NOTE — ED Triage Notes (Signed)
Pt reports chest pain that started this evening. She states that she thinks it may be anxiety. Reports radiation down both arms.

## 2019-07-02 ENCOUNTER — Encounter (HOSPITAL_COMMUNITY): Payer: Self-pay

## 2019-07-02 ENCOUNTER — Emergency Department (HOSPITAL_COMMUNITY)
Admission: EM | Admit: 2019-07-02 | Discharge: 2019-07-03 | Disposition: A | Discharge status: Home / Self Care | Payer: Medicaid Other | Attending: Emergency Medicine | Admitting: Emergency Medicine

## 2019-07-02 ENCOUNTER — Other Ambulatory Visit: Payer: Self-pay

## 2019-07-02 DIAGNOSIS — Z79899 Other long term (current) drug therapy: Secondary | ICD-10-CM | POA: Insufficient documentation

## 2019-07-02 DIAGNOSIS — Z20822 Contact with and (suspected) exposure to covid-19: Secondary | ICD-10-CM | POA: Diagnosis not present

## 2019-07-02 DIAGNOSIS — H9203 Otalgia, bilateral: Secondary | ICD-10-CM | POA: Insufficient documentation

## 2019-07-02 DIAGNOSIS — F419 Anxiety disorder, unspecified: Secondary | ICD-10-CM | POA: Insufficient documentation

## 2019-07-02 DIAGNOSIS — R070 Pain in throat: Secondary | ICD-10-CM | POA: Diagnosis not present

## 2019-07-02 DIAGNOSIS — J029 Acute pharyngitis, unspecified: Secondary | ICD-10-CM

## 2019-07-02 NOTE — ED Triage Notes (Signed)
Patient arrived stating that she has anxiety and has recently been taking an antibiotic. Patient read there could be an interaction and concerned if she would have been taking them together because she is feeling more anxious.

## 2019-07-03 LAB — SARS CORONAVIRUS 2 (TAT 6-24 HRS): SARS Coronavirus 2: NEGATIVE

## 2019-07-03 NOTE — ED Provider Notes (Signed)
[facility]-EMERGENCY DEPT Provider Note   CSN: [id number] Arrival date & time: 07/02/19  2331     History No chief complaint on file.   Kathleen Swanson is a 24 y.o. female.  Patient to ED with concern about her medications. She reports having received 1000 mg Zithromax on 06/28/19. She take hydroxyzine for anxiety and has been afraid to take it because of possible reactions between the two medications. She is having her usual symptoms of anxiety but does not feel out of control. No SI/HI. She also reports onset of sore throat and bilateral ear pain over the course of the last 2 days. No fever, congestion or difficulty swallowing. She reports infrequent cough. No known sick exposures.   The history is provided by the patient. No language interpreter was used.       Past Medical History:  Diagnosis Date  . Anxiety     There are no problems to display for this patient.   History reviewed. No pertinent surgical history.   OB History   No obstetric history on file.     No family history on file.  Social History   Tobacco Use  . Smoking status: Never Smoker  . Smokeless tobacco: Never Used  Substance Use Topics  . Alcohol use: No  . Drug use: No    Home Medications Prior to Admission medications   Medication Sig Start Date End Date Taking? Authorizing Provider  hydrOXYzine (ATARAX/VISTARIL) 25 MG tablet Take 1 tablet (25 mg total) by mouth every 6 (six) hours. 06/28/16   Bethel Born, PA-C  methocarbamol (ROBAXIN) 500 MG tablet Take 1 tablet (500 mg total) by mouth 2 (two) times daily. 09/10/16   Barrett Henle, PA-C  naproxen (NAPROSYN) 500 MG tablet Take 1 tablet (500 mg total) by mouth 2 (two) times daily. 09/10/16   Barrett Henle, PA-C    Allergies    Patient has no known allergies.  Review of Systems   Review of Systems  Constitutional: Negative for chills and fever.  HENT: Positive for ear pain and sore throat.  Negative for congestion and trouble swallowing.   Respiratory: Positive for cough. Negative for shortness of breath.   Cardiovascular: Negative.  Negative for chest pain.  Gastrointestinal: Negative.  Negative for abdominal pain and nausea.  Musculoskeletal: Negative.   Skin: Negative.   Neurological: Negative.  Negative for headaches.  Psychiatric/Behavioral: Negative for suicidal ideas. The patient is nervous/anxious.     Physical Exam Updated Vital Signs BP 119/69 (BP Location: Left Arm)   Pulse 75   Temp 98.1 F (36.7 C) (Oral)   Resp 14   Ht 5\' 4"  (1.626 m)   Wt 61.2 kg   SpO2 100%   BMI 23.17 kg/m   Physical Exam Vitals and nursing note reviewed.  Constitutional:      Appearance: She is well-developed.  HENT:     Head: Normocephalic.     Nose: Nose normal.     Mouth/Throat:     Mouth: Mucous membranes are moist.     Pharynx: No oropharyngeal exudate or posterior oropharyngeal erythema.  Eyes:     Conjunctiva/sclera: Conjunctivae normal.  Cardiovascular:     Rate and Rhythm: Normal rate and regular rhythm.     Heart sounds: No murmur.  Pulmonary:     Effort: Pulmonary effort is normal.     Breath sounds: Normal breath sounds. No wheezing, rhonchi or rales.  Abdominal:     General: Bowel sounds  are normal.     Palpations: Abdomen is soft.     Tenderness: There is no abdominal tenderness. There is no guarding or rebound.  Musculoskeletal:        General: Normal range of motion.     Cervical back: Normal range of motion and neck supple.  Lymphadenopathy:     Cervical: No cervical adenopathy.  Skin:    General: Skin is warm and dry.     Findings: No rash.  Neurological:     Mental Status: She is alert and oriented to person, place, and time.  Psychiatric:        Mood and Affect: Mood normal.        Behavior: Behavior normal.     ED Results / Procedures / Treatments   Labs (all labs ordered are listed, but only abnormal results are displayed) Labs  Reviewed - No data to display  EKG None  Radiology No results found.  Procedures Procedures (including critical care time)  Medications Ordered in ED Medications - No data to display  ED Course  I have reviewed the triage vital signs and the nursing notes.  Pertinent labs & imaging results that were available during my care of the patient were reviewed by me and considered in my medical decision making (see chart for details).    MDM Rules/Calculators/A&P                      Patient to ED with concern for potential medication reactions between recent single dose zithromax and her hydroxyzine for anxiety. Per pharmacy review, there is a risk of QT prolongation, however, no QTc on recent EKG. She is reassured she can take her regular medication.   No significant findings on exam of her ears and throat. No fever. Will provide send out COVID testing.   She is felt appropriate for discharge home.  Final Clinical Impression(s) / ED Diagnoses Final diagnoses:  None   1. Anxiety 2. Pharyngitis    Rx / DC Orders ED Discharge Orders    None       Charlann Lange, PA-C 07/03/19 0051    Ripley Fraise, MD 07/03/19 [phone]

## 2019-07-03 NOTE — Discharge Instructions (Addendum)
Per review with the pharmacy, it is ok to take your hydroxyzine for anxiety management.   Your COVID test has been sent and results can be reviewed on MyChart in 6-24 hours.

## 2019-07-07 ENCOUNTER — Emergency Department (HOSPITAL_COMMUNITY)
Admission: EM | Admit: 2019-07-07 | Discharge: 2019-07-07 | Disposition: A | Discharge status: Home / Self Care | Payer: Medicaid Other | Attending: Emergency Medicine | Admitting: Emergency Medicine

## 2019-07-07 ENCOUNTER — Encounter (HOSPITAL_COMMUNITY): Payer: Self-pay

## 2019-07-07 ENCOUNTER — Other Ambulatory Visit: Payer: Self-pay

## 2019-07-07 DIAGNOSIS — K219 Gastro-esophageal reflux disease without esophagitis: Secondary | ICD-10-CM

## 2019-07-07 DIAGNOSIS — Z79899 Other long term (current) drug therapy: Secondary | ICD-10-CM | POA: Insufficient documentation

## 2019-07-07 DIAGNOSIS — J029 Acute pharyngitis, unspecified: Secondary | ICD-10-CM | POA: Diagnosis present

## 2019-07-07 LAB — GROUP A STREP BY PCR: Group A Strep by PCR: NOT DETECTED

## 2019-07-07 MED ORDER — LIDOCAINE VISCOUS HCL 2 % MT SOLN
15.0000 mL | Freq: Once | OROMUCOSAL | Status: AC
  Administered 2019-07-07: 15 mL via ORAL
  Filled 2019-07-07: qty 15

## 2019-07-07 MED ORDER — ALUM & MAG HYDROXIDE-SIMETH 200-200-20 MG/5ML PO SUSP
30.0000 mL | Freq: Once | ORAL | Status: AC
  Administered 2019-07-07: 30 mL via ORAL
  Filled 2019-07-07: qty 30

## 2019-07-07 NOTE — ED Notes (Signed)
Dr. Palumbo at bedside. 

## 2019-07-07 NOTE — ED Provider Notes (Signed)
[facility] DEPT Provider Note   CSN: [id number] Arrival date & time: 07/07/19  0001     History Chief Complaint  Patient presents with  . Sore Throat  . Emesis  . Anxiety    Kathleen Swanson is a 24 y.o. female.  The history is provided by the patient.  Sore Throat This is a new problem. The current episode started more than 1 week ago. The problem occurs constantly. The problem has not changed since onset.Pertinent negatives include no chest pain, no abdominal pain and no shortness of breath. Associated symptoms comments: Burning up the esophagus, has known GERD but is not taking anything for same.  . Nothing aggravates the symptoms. She has tried nothing for the symptoms.  Also has anxiety and has been seen at multiple EDs for this an other somatic complaints but has not followed up.       Past Medical History:  Diagnosis Date  . Anxiety     There are no problems to display for this patient.   History reviewed. No pertinent surgical history.   OB History   No obstetric history on file.     History reviewed. No pertinent family history.  Social History   Tobacco Use  . Smoking status: Never Smoker  . Smokeless tobacco: Never Used  Substance Use Topics  . Alcohol use: No  . Drug use: No    Home Medications Prior to Admission medications   Medication Sig Start Date End Date Taking? Authorizing Provider  hydrOXYzine (VISTARIL) 50 MG capsule Take 50 mg by mouth 2 (two) times daily as needed for anxiety.    Yes [provider]  valACYclovir (VALTREX) 500 MG tablet Take 500 mg by mouth daily.   Yes [provider]  hydrOXYzine (ATARAX/VISTARIL) 25 MG tablet Take 1 tablet (25 mg total) by mouth every 6 (six) hours. Patient not taking: Reported on 07/07/2019 06/28/16   Recardo Evangelist, PA-C  methocarbamol (ROBAXIN) 500 MG tablet Take 1 tablet (500 mg total) by mouth 2 (two) times daily. Patient not taking: Reported on  07/07/2019 09/10/16   Nona Dell, PA-C  naproxen (NAPROSYN) 500 MG tablet Take 1 tablet (500 mg total) by mouth 2 (two) times daily. Patient not taking: Reported on 07/07/2019 09/10/16   Nona Dell, PA-C    Allergies    Patient has no known allergies.  Review of Systems   Review of Systems  Constitutional: Negative for fever.  HENT: Negative for congestion, drooling and facial swelling.   Eyes: Negative for visual disturbance.  Respiratory: Negative for cough, choking and shortness of breath.   Cardiovascular: Negative for chest pain and leg swelling.  Gastrointestinal: Negative for abdominal pain.  Genitourinary: Negative for difficulty urinating.  Musculoskeletal: Negative for arthralgias.  Skin: Negative for color change.  Neurological: Negative for dizziness.  Psychiatric/Behavioral: Negative for hallucinations and suicidal ideas. The patient is nervous/anxious.   All other systems reviewed and are negative.   Physical Exam Updated Vital Signs BP 122/73 (BP Location: Left Arm)   Pulse 69   Temp 98.3 F (36.8 C) (Oral)   Resp 14   Ht 5\' 4"  (1.626 m)   Wt 63.5 kg   LMP 06/10/2019   SpO2 99%   BMI 24.03 kg/m   Physical Exam Vitals and nursing note reviewed.  Constitutional:      General: She is not in acute distress.    Appearance: Normal appearance.  HENT:     Head: Normocephalic  and atraumatic.     Nose: Nose normal.     Mouth/Throat:     Mouth: Mucous membranes are moist.     Pharynx: Oropharynx is clear. No oropharyngeal exudate.     Comments: No swelling of the lips tongue or uvula.  No lesions.  Intact phonation.  No pain with displacement of the larynx Eyes:     Conjunctiva/sclera: Conjunctivae normal.     Pupils: Pupils are equal, round, and reactive to light.  Cardiovascular:     Rate and Rhythm: Normal rate and regular rhythm.     Pulses: Normal pulses.     Heart sounds: Normal heart sounds.  Pulmonary:     Effort: Pulmonary  effort is normal.     Breath sounds: Normal breath sounds.  Abdominal:     General: Abdomen is flat. Bowel sounds are normal.     Tenderness: There is no abdominal tenderness. There is no guarding.  Musculoskeletal:        General: Normal range of motion.     Cervical back: Normal range of motion and neck supple.  Skin:    General: Skin is warm and dry.     Capillary Refill: Capillary refill takes less than 2 seconds.  Neurological:     General: No focal deficit present.     Mental Status: She is alert and oriented to person, place, and time.     Deep Tendon Reflexes: Reflexes normal.  Psychiatric:        Mood and Affect: Mood normal.        Behavior: Behavior normal.     ED Results / Procedures / Treatments   Labs (all labs ordered are listed, but only abnormal results are displayed) Labs Reviewed  GROUP A STREP BY PCR    EKG None  Radiology No results found.  Procedures Procedures (including critical care time)  Medications Ordered in ED Medications  alum & mag hydroxide-simeth (MAALOX/MYLANTA) 200-200-20 MG/5ML suspension 30 mL (30 mLs Oral Given 07/07/19 0243)    And  lidocaine (XYLOCAINE) 2 % viscous mouth solution 15 mL (15 mLs Oral Given 07/07/19 0243)    ED Course  I have reviewed the triage vital signs and the nursing notes.  Pertinent labs & imaging results that were available during my care of the patient were reviewed by me and considered in my medical decision making (see chart for details).    No signs of infection.  Negative Covid 48 hours ago. No indication for imaging nor antibiotics at this time.  Symptoms are consistent with GERD.  Will treat for GERD and start PPI.  No acute mental health emergency.  Will provide outpatient resources.  Instructed to follow up with her PMD as well.    Kathleen Swanson was evaluated in Emergency Department on 07/07/2019 for the symptoms described in the history of present illness. She was evaluated in the context of the  global COVID-19 pandemic, which necessitated consideration that the patient might be at risk for infection with the SARS-CoV-2 virus that causes COVID-19. Institutional protocols and algorithms that pertain to the evaluation of patients at risk for COVID-19 are in a state of rapid change based on information released by regulatory bodies including the CDC and federal and state organizations. These policies and algorithms were followed during the patient's care in the ED.  Final Clinical Impression(s) / ED Diagnoses Return for weakness, numbness, changes in vision or speech, fevers >100.4 unrelieved by medication, shortness of breath, intractable vomiting, or diarrhea, abdominal  pain, Inability to tolerate liquids or food, cough, altered mental status or any concerns. No signs of systemic illness or infection. The patient is nontoxic-appearing on exam and vital signs are within normal limits.   I have reviewed the triage vital signs and the nursing notes. Pertinent labs &imaging results that were available during my care of the patient were reviewed by me and considered in my medical decision making (see chart for details).  After history, exam, and medical workup I feel the patient has been appropriately medically screened and is safe for discharge home. Pertinent diagnoses were discussed with the patient. Patient was given return precautions      Janey Petron, MD 07/07/19 4650

## 2019-07-07 NOTE — ED Triage Notes (Signed)
Arrived POV patient reports sore throat for over 1 week, nausea and vomiting, and anxiety. Patient also reports tenderness of right and left submandibular lymph nodes. Patient reports taking Hydroxyzine around 2330 tonight for her anxiety, but it has not gotten any better yet.

## 2019-07-09 ENCOUNTER — Emergency Department (HOSPITAL_COMMUNITY)
Admission: EM | Admit: 2019-07-09 | Discharge: 2019-07-09 | Disposition: A | Discharge status: Home / Self Care | Payer: Medicaid Other | Attending: Emergency Medicine | Admitting: Emergency Medicine

## 2019-07-09 ENCOUNTER — Other Ambulatory Visit: Payer: Self-pay

## 2019-07-09 DIAGNOSIS — Z79899 Other long term (current) drug therapy: Secondary | ICD-10-CM | POA: Diagnosis not present

## 2019-07-09 DIAGNOSIS — R002 Palpitations: Secondary | ICD-10-CM | POA: Diagnosis not present

## 2019-07-09 DIAGNOSIS — F419 Anxiety disorder, unspecified: Secondary | ICD-10-CM | POA: Diagnosis not present

## 2019-07-09 NOTE — ED Provider Notes (Addendum)
[facility] DEPT Provider Note   CSN: [id number] Arrival date & time: 07/09/19  0400     History Chief Complaint  Patient presents with  . Anxiety    Kathleen Swanson is a 24 y.o. female.  HPI     This is a 24 year old female who presents with increasing anxiety and palpitations.  Patient reports that earlier this evening she experienced increased anxiety which resulted in palpitations, home sweating, and shortness of breath.  This is consistent with her prior episodes of anxiety.  Patient reports significant increase stressors.  She has been seen and evaluated in the emergency room for this.  She was given Vistaril.  Patient reports she took Vistaril at midnight tonight without significant relief of her symptoms.  She reports that her symptoms are distressing to her.  She has follow-up with her primary physician on April 1 for further evaluation.  She does not have a psychiatrist or a Social worker.  Patient currently states that she is overall improved and is not currently having any symptoms.  Denies alcohol or drug use  Past Medical History:  Diagnosis Date  . Anxiety     There are no problems to display for this patient.   No past surgical history on file.   OB History   No obstetric history on file.     No family history on file.  Social History   Tobacco Use  . Smoking status: Never Smoker  . Smokeless tobacco: Never Used  Substance Use Topics  . Alcohol use: No  . Drug use: No    Home Medications Prior to Admission medications   Medication Sig Start Date End Date Taking? Authorizing Provider  hydrOXYzine (ATARAX/VISTARIL) 25 MG tablet Take 1 tablet (25 mg total) by mouth every 6 (six) hours. Patient not taking: Reported on 07/07/2019 06/28/16   Recardo Evangelist, PA-C  hydrOXYzine (VISTARIL) 50 MG capsule Take 50 mg by mouth 2 (two) times daily as needed for anxiety.     [provider]  methocarbamol (ROBAXIN) 500 MG  tablet Take 1 tablet (500 mg total) by mouth 2 (two) times daily. Patient not taking: Reported on 07/07/2019 09/10/16   Nona Dell, PA-C  naproxen (NAPROSYN) 500 MG tablet Take 1 tablet (500 mg total) by mouth 2 (two) times daily. Patient not taking: Reported on 07/07/2019 09/10/16   Nona Dell, PA-C  valACYclovir (VALTREX) 500 MG tablet Take 500 mg by mouth daily.    [provider]    Allergies    Patient has no known allergies.  Review of Systems   Review of Systems  Constitutional: Negative for fever.  Respiratory: Positive for shortness of breath.   Cardiovascular: Positive for palpitations. Negative for chest pain.  Gastrointestinal: Negative for abdominal pain and nausea.  Psychiatric/Behavioral: Negative for sleep disturbance and suicidal ideas. The patient is nervous/anxious.   All other systems reviewed and are negative.   Physical Exam Updated Vital Signs BP 123/86 (BP Location: Left Arm)   Pulse 67   Temp 98.3 F (36.8 C) (Oral)   Resp 16   Ht 1.626 m (5\' 4" )   Wt 63.5 kg   LMP 06/10/2019   SpO2 100%   BMI 24.03 kg/m   Physical Exam Vitals and nursing note reviewed.  Constitutional:      Appearance: She is well-developed. She is not ill-appearing.     Comments: Resting comfortably upon entering the room  HENT:     Head: Normocephalic and atraumatic.  Mouth/Throat:     Mouth: Mucous membranes are moist.  Eyes:     Pupils: Pupils are equal, round, and reactive to light.  Cardiovascular:     Rate and Rhythm: Normal rate and regular rhythm.  Pulmonary:     Effort: Pulmonary effort is normal. No respiratory distress.     Breath sounds: No wheezing.  Musculoskeletal:     Cervical back: Neck supple.     Right lower leg: No edema.     Left lower leg: No edema.  Skin:    General: Skin is warm and dry.  Neurological:     Mental Status: She is alert and oriented to person, place, and time.  Psychiatric:        Mood and  Affect: Mood normal.     ED Results / Procedures / Treatments   Labs (all labs ordered are listed, but only abnormal results are displayed) Labs Reviewed - No data to display  EKG EKG Interpretation  Date/Time:  Sunday July 09 2019 05:09:32 EDT Ventricular Rate:  71 PR Interval:    QRS Duration: 98 QT Interval:  401 QTC Calculation: 436 R Axis:   69 Text Interpretation: Sinus rhythm RSR' in V1 or V2, right VCD or RVH Confirmed by Ross Marcus ([id number]) on 07/09/2019 11:15:36 PM   Radiology No results found.  Procedures Procedures (including critical care time)  Medications Ordered in ED Medications - No data to display  ED Course  I have reviewed the triage vital signs and the nursing notes.  Pertinent labs & imaging results that were available during my care of the patient were reviewed by me and considered in my medical decision making (see chart for details).    MDM Rules/Calculators/A&P                      Patient presents with palpitations and shortness of breath related to anxiety.  She has been seen in the emergency room 5 times since the beginning of March for multiple somatic complaints.  Patient ports that "I just do not know where else to go."  She has been given Vistaril with little effect.  She is currently symptom-free.  EKG without arrhythmia or ischemia.  She is overall nontoxic and vital signs are reassuring.  I discussed with her that she may need a longer acting SSRI.  Recommend follow-up with her primary physician for this.  We discussed techniques for reducing stress and anxiety at home and her symptoms when they occur.  She was also provided community resources.  Of note, based on chart review it does appear that many of her symptoms are related to recently having given birth in November.  ?Post partum depression.  Patient denies any suicidal or homicidality.  Recommend close follow-up.  After history, exam, and medical workup I feel the patient has  been appropriately medically screened and is safe for discharge home. Pertinent diagnoses were discussed with the patient. Patient was given return precautions.  Final Clinical Impression(s) / ED Diagnoses Final diagnoses:  Anxiety  Palpitations    Rx / DC Orders ED Discharge Orders    None       Andres Vest, Mayer Masker, MD 07/09/19 6045    Shon Baton, MD 07/09/19 2316

## 2019-07-09 NOTE — ED Triage Notes (Signed)
Patient c/o anxiety started last night and got worsen this morning. Pt stated she took vistaril at 12 am but no relief. Patient denies N/V. Pt a/ox4.

## 2019-08-27 ENCOUNTER — Other Ambulatory Visit: Payer: Self-pay

## 2019-08-27 ENCOUNTER — Encounter (HOSPITAL_COMMUNITY): Payer: Self-pay

## 2019-08-27 DIAGNOSIS — Y9389 Activity, other specified: Secondary | ICD-10-CM | POA: Diagnosis not present

## 2019-08-27 DIAGNOSIS — X58XXXA Exposure to other specified factors, initial encounter: Secondary | ICD-10-CM | POA: Insufficient documentation

## 2019-08-27 DIAGNOSIS — Z79899 Other long term (current) drug therapy: Secondary | ICD-10-CM | POA: Diagnosis not present

## 2019-08-27 DIAGNOSIS — T1592XA Foreign body on external eye, part unspecified, left eye, initial encounter: Secondary | ICD-10-CM | POA: Diagnosis present

## 2019-08-27 DIAGNOSIS — Y998 Other external cause status: Secondary | ICD-10-CM | POA: Insufficient documentation

## 2019-08-27 DIAGNOSIS — Y929 Unspecified place or not applicable: Secondary | ICD-10-CM | POA: Insufficient documentation

## 2019-08-27 NOTE — ED Triage Notes (Signed)
Pt got fingernail glue in left eye approx 2200. Vision at baseline.

## 2019-08-28 ENCOUNTER — Emergency Department (HOSPITAL_COMMUNITY)
Admission: EM | Admit: 2019-08-28 | Discharge: 2019-08-28 | Disposition: A | Discharge status: Home / Self Care | Payer: Medicaid Other | Attending: Emergency Medicine | Admitting: Emergency Medicine

## 2019-08-28 DIAGNOSIS — T1592XA Foreign body on external eye, part unspecified, left eye, initial encounter: Secondary | ICD-10-CM

## 2019-08-28 MED ORDER — ERYTHROMYCIN 5 MG/GM OP OINT
TOPICAL_OINTMENT | Freq: Once | OPHTHALMIC | Status: AC
  Administered 2019-08-28: 1 via OPHTHALMIC
  Filled 2019-08-28: qty 3.5

## 2019-08-28 MED ORDER — FLUORESCEIN SODIUM 1 MG OP STRP
1.0000 | ORAL_STRIP | Freq: Once | OPHTHALMIC | Status: AC
  Administered 2019-08-28: 1 via OPHTHALMIC
  Filled 2019-08-28: qty 1

## 2019-08-28 MED ORDER — TETRACAINE HCL 0.5 % OP SOLN
2.0000 [drp] | Freq: Once | OPHTHALMIC | Status: AC
  Administered 2019-08-28: 2 [drp] via OPHTHALMIC
  Filled 2019-08-28: qty 4

## 2019-08-28 NOTE — ED Notes (Signed)
Poison control called back for re-evaluation. Told them patient is discharged with erythromycin ointment and opthamology will be expecting her in the AM.

## 2019-08-28 NOTE — Discharge Instructions (Addendum)
Thank you for allowing me to care for you today in the Emergency Department.   Please go to Dr. Laruth Bouchard office tomorrow.  He is expecting you for reevaluation.  Apply a 0.5 cm ribbon of erythromycin ointment to your left eye every 2 to 3 hours until you are seen by Dr. Dione Booze.  Return to the emergency department if you start to have changes in your vision, significant redness or swelling around the eye, purulent drainage from the eye, or other new, concerning symptoms before you are seen by ophthalmology.

## 2019-08-28 NOTE — ED Notes (Signed)
Pts left eye flushed with 53ml of normal saline, and erythromycin ointment placed in eye following. Guaze was soaked in the erythromycin ointment and pt instructed to hold over eye per poison control instruction.

## 2019-08-28 NOTE — ED Notes (Signed)
Pt a/o and ambulatory at discharge. Pt educated to follow up with opthalmologist tomorrow and use ointment every 2-3 hours.

## 2019-08-28 NOTE — ED Provider Notes (Signed)
[facility]-EMERGENCY DEPT Provider Note   CSN: [id number] Arrival date & time: 08/27/19  2244     History Chief Complaint  Patient presents with  . Foreign Body in Eye    Kathleen Swanson is a 24 y.o. female with a history of anxiety who presents the emergency department with a chief complaint of left eye foreign body.  Reports that earlier tonight she unintentionally got fingernail glue in her left eye at approximately 10:00.  She is reporting irritation and pain to the left eye, but no changes in her vision.  No right eye complaints.  No drainage, diplopia, periorbital swelling, fever, chills.  She attempted to irrigate the eye, but no other treatment prior to arrival.  She does not wear contacts.  Tdap is up-to-date.  The history is provided by the patient. No language interpreter was used.       Past Medical History:  Diagnosis Date  . Anxiety     There are no problems to display for this patient.   History reviewed. No pertinent surgical history.   OB History   No obstetric history on file.     No family history on file.  Social History   Tobacco Use  . Smoking status: Never Smoker  . Smokeless tobacco: Never Used  Substance Use Topics  . Alcohol use: No  . Drug use: No    Home Medications Prior to Admission medications   Medication Sig Start Date End Date Taking? Authorizing Provider  hydrOXYzine (VISTARIL) 50 MG capsule Take 50 mg by mouth 2 (two) times daily as needed for anxiety.     [provider]  valACYclovir (VALTREX) 500 MG tablet Take 500 mg by mouth daily.    [provider]    Allergies    Patient has no known allergies.  Review of Systems   Review of Systems  Constitutional: Negative for activity change, chills and fever.  Eyes: Positive for pain and redness. Negative for photophobia and visual disturbance.  Respiratory: Negative for shortness of breath.   Cardiovascular: Negative for chest pain.    Gastrointestinal: Negative for abdominal pain.  Genitourinary: Negative for dysuria.  Musculoskeletal: Negative for back pain.  Skin: Negative for rash and wound.  Allergic/Immunologic: Negative for immunocompromised state.  Neurological: Negative for weakness and headaches.  Psychiatric/Behavioral: Negative for confusion.    Physical Exam Updated Vital Signs BP 129/83   Pulse 70   Temp 98.3 F (36.8 C) (Oral)   Resp 18   Ht 5\' 4"  (1.626 m)   Wt 63.5 kg   SpO2 100%   BMI 24.03 kg/m   Physical Exam Vitals and nursing note reviewed.  Constitutional:      General: She is not in acute distress.    Appearance: She is not ill-appearing, toxic-appearing or diaphoretic.  HENT:     Head: Normocephalic.  Eyes:     General: Lids are normal. Vision grossly intact.        Right eye: No foreign body, discharge or hordeolum.        Left eye: Foreign body present.No discharge or hordeolum.     Extraocular Movements: Extraocular movements intact.     Conjunctiva/sclera:     Right eye: Right conjunctiva is not injected. No chemosis, exudate or hemorrhage.    Left eye: Left conjunctiva is injected. No chemosis, exudate or hemorrhage.    Pupils:     Left eye: No corneal abrasion or fluorescein uptake. Seidel exam negative.  Slit lamp exam:    Right eye: No photophobia.     Left eye: No photophobia.      Comments: Vertical appearing foreign body noted in the medial portion of the left eye.  She is able to produce tears in the left eye.  Cardiovascular:     Rate and Rhythm: Normal rate and regular rhythm.     Heart sounds: No murmur. No friction rub. No gallop.   Pulmonary:     Effort: Pulmonary effort is normal. No respiratory distress.  Abdominal:     General: There is no distension.     Palpations: Abdomen is soft.  Musculoskeletal:     Cervical back: Neck supple.  Skin:    General: Skin is warm.     Findings: No rash.  Neurological:     Mental Status: She is alert.   Psychiatric:        Behavior: Behavior normal.        ED Results / Procedures / Treatments   Labs (all labs ordered are listed, but only abnormal results are displayed) Labs Reviewed - No data to display  EKG None  Radiology No results found.  Procedures Procedures (including critical care time)  Medications Ordered in ED Medications  erythromycin ophthalmic ointment (1 application Left Eye Given 08/28/19 0129)  fluorescein ophthalmic strip 1 strip (1 strip Left Eye Given 08/28/19 0404)  tetracaine (PONTOCAINE) 0.5 % ophthalmic solution 2 drop (2 drops Left Eye Given 08/28/19 0405)    ED Course  I have reviewed the triage vital signs and the nursing notes.  Pertinent labs & imaging results that were available during my care of the patient were reviewed by me and considered in my medical decision making (see chart for details).    MDM Rules/Calculators/A&P                      24 year old female with history of anxiety presenting with nail glue in her left eye that occurred approximately 22:00 tonight.  She has no other associated symptoms.  Vital signs are normal.  On exam, she appears to have glue located on the medial portion of the left eye.  The eye was copiously irrigated and she was given erythromycin ointment.  She reports significant improvement in her left eye pain following erythromycin ointment.  Extraocular movements are intact.  Pupils are equal round and reactive.  There is no fluorescein uptake on exam concerning for corneal abrasion.  Discussed the patient with Dr. Christy Gentles, attending physician.  Consulted ophthalmology and spoke with Dr. Katy Fitch, ophthalmology.  He recommended erythromycin ointment in the left eye every 2-3 hours.  He would like the patient to follow-up with him in the clinic tomorrow.  No other recommendations at this time.  Patient has been advised of this plan and recommendations.  She is agreeable, and will follow up in his office  tomorrow.  All questions answered.  She is hemodynamically stable to no acute distress.  ER return precautions given.  Safe for discharge to home with outpatient ophthalmology follow-up.   Final Clinical Impression(s) / ED Diagnoses Final diagnoses:  Foreign body of left eye, initial encounter    Rx / DC Orders ED Discharge Orders    None       Halli Equihua A, PA-C 08/28/19 3500    Ripley Fraise, MD 08/29/19 0330

## 2019-08-28 NOTE — ED Notes (Signed)
pts vision 20/25 for both L/R eye.

## 2019-09-25 DIAGNOSIS — O99351 Diseases of the nervous system complicating pregnancy, first trimester: Secondary | ICD-10-CM | POA: Diagnosis present

## 2019-09-25 DIAGNOSIS — R110 Nausea: Secondary | ICD-10-CM | POA: Diagnosis not present

## 2019-09-25 DIAGNOSIS — O219 Vomiting of pregnancy, unspecified: Secondary | ICD-10-CM | POA: Diagnosis not present

## 2019-09-25 DIAGNOSIS — Z3A08 8 weeks gestation of pregnancy: Secondary | ICD-10-CM | POA: Insufficient documentation

## 2019-09-25 DIAGNOSIS — G43109 Migraine with aura, not intractable, without status migrainosus: Secondary | ICD-10-CM | POA: Insufficient documentation

## 2019-09-25 DIAGNOSIS — O26891 Other specified pregnancy related conditions, first trimester: Secondary | ICD-10-CM | POA: Insufficient documentation

## 2019-09-26 ENCOUNTER — Emergency Department (HOSPITAL_COMMUNITY)
Admission: EM | Admit: 2019-09-26 | Discharge: 2019-09-26 | Disposition: A | Discharge status: Home / Self Care | Payer: Medicaid Other | Attending: Emergency Medicine | Admitting: Emergency Medicine

## 2019-09-26 ENCOUNTER — Encounter (HOSPITAL_COMMUNITY): Payer: Self-pay

## 2019-09-26 ENCOUNTER — Other Ambulatory Visit: Payer: Self-pay

## 2019-09-26 DIAGNOSIS — Z3A08 8 weeks gestation of pregnancy: Secondary | ICD-10-CM

## 2019-09-26 DIAGNOSIS — E860 Dehydration: Secondary | ICD-10-CM

## 2019-09-26 DIAGNOSIS — R112 Nausea with vomiting, unspecified: Secondary | ICD-10-CM

## 2019-09-26 DIAGNOSIS — G43109 Migraine with aura, not intractable, without status migrainosus: Secondary | ICD-10-CM

## 2019-09-26 MED ORDER — SODIUM CHLORIDE 0.9 % IV BOLUS
1000.0000 mL | Freq: Once | INTRAVENOUS | Status: AC
  Administered 2019-09-26: 1000 mL via INTRAVENOUS

## 2019-09-26 MED ORDER — METOCLOPRAMIDE HCL 5 MG/ML IJ SOLN
10.0000 mg | Freq: Once | INTRAMUSCULAR | Status: AC
  Administered 2019-09-26: 10 mg via INTRAVENOUS
  Filled 2019-09-26: qty 2

## 2019-09-26 MED ORDER — ONDANSETRON HCL 4 MG PO TABS
4.0000 mg | ORAL_TABLET | Freq: Three times a day (TID) | ORAL | 0 refills | Status: DC | PRN
Start: 2019-09-26 — End: 2023-12-06

## 2019-09-26 MED ORDER — DIPHENHYDRAMINE HCL 50 MG/ML IJ SOLN
25.0000 mg | Freq: Once | INTRAMUSCULAR | Status: AC
  Administered 2019-09-26: 25 mg via INTRAVENOUS
  Filled 2019-09-26: qty 1

## 2019-09-26 NOTE — Discharge Instructions (Addendum)
Drink plenty of fluids.  Use the Zofran for nausea or vomiting.  [facility].

## 2019-09-26 NOTE — ED Provider Notes (Signed)
[facility] DEPT Provider Note   CSN: [id number] Arrival date & time: 09/25/19  2355   Time seen 1:20 AM  History Chief Complaint  Patient presents with  . Migraine    Kathleen Swanson is a 24 y.o. female.  HPI   Patient states she is G3, P2 Ab0, approximately [redacted] weeks pregnant.  She states [facility] and was given an abortion pill that starts with an M at 37 AM.  She is to take the second dose tomorrow after which she should expect some abdominal pain and vaginal bleeding.  She denies that today.  She states she has a headache that she describes as a migraine.  She states it started a few hours ago.  It is in the posterior right side of her head.  She describes it as sharp and constant.  She states she has had it before.  She states when it first started she was seeing spots but those are gone now.  She denies any new numbness or tingling.  She does describe photosensitivity but denies phono sensitivity.  She states she has had lots of nausea and vomiting with this pregnancy and vomits 2-3 times a day, she has vomited 3 times today.  PCP Conception Chancy, FNP   Past Medical History:  Diagnosis Date  . Anxiety     There are no problems to display for this patient.   History reviewed. No pertinent surgical history.   OB History   No obstetric history on file.     No family history on file.  Social History   Tobacco Use  . Smoking status: Never Smoker  . Smokeless tobacco: Never Used  Substance Use Topics  . Alcohol use: No  . Drug use: No    Home Medications Prior to Admission medications   Medication Sig Start Date End Date Taking? Authorizing Provider  hydrOXYzine (VISTARIL) 50 MG capsule Take 50 mg by mouth 2 (two) times daily as needed for anxiety.     [provider]  ondansetron (ZOFRAN) 4 MG tablet Take 1 tablet (4 mg total) by mouth every 8 (eight) hours as needed for nausea or vomiting. 09/26/19    Rolland Porter, MD  valACYclovir (VALTREX) 500 MG tablet Take 500 mg by mouth daily.    [provider]    Allergies    Patient has no known allergies.  Review of Systems   Review of Systems  All other systems reviewed and are negative.   Physical Exam Updated Vital Signs BP 118/69   Pulse (!) 56   Temp 98 F (36.7 C) (Oral)   Resp 18   Ht 5\' 4"  (1.626 m)   Wt 63.5 kg   SpO2 100%   BMI 24.03 kg/m   Physical Exam Vitals and nursing note reviewed.  Constitutional:      Appearance: Normal appearance. She is normal weight.  HENT:     Head: Normocephalic and atraumatic.     Right Ear: External ear normal.     Left Ear: External ear normal.  Eyes:     Extraocular Movements: Extraocular movements intact.     Conjunctiva/sclera: Conjunctivae normal.     Pupils: Pupils are equal, round, and reactive to light.  Cardiovascular:     Rate and Rhythm: Normal rate and regular rhythm.     Pulses: Normal pulses.  Pulmonary:     Effort: Pulmonary effort is normal. No respiratory distress.     Breath  sounds: Normal breath sounds.  Abdominal:     General: Abdomen is flat. Bowel sounds are normal. There is no distension.     Palpations: Abdomen is soft.     Tenderness: There is no abdominal tenderness.  Musculoskeletal:        General: Normal range of motion.     Cervical back: Normal range of motion.  Skin:    General: Skin is warm and dry.  Neurological:     General: No focal deficit present.     Mental Status: She is alert and oriented to person, place, and time.     Cranial Nerves: No cranial nerve deficit.  Psychiatric:        Mood and Affect: Mood normal.        Behavior: Behavior normal.        Thought Content: Thought content normal.     ED Results / Procedures / Treatments   Labs (all labs ordered are listed, but only abnormal results are displayed) Labs Reviewed - No data to display  EKG None  Radiology No results found.  Procedures Procedures  (including critical care time)  Medications Ordered in ED Medications  sodium chloride 0.9 % bolus 1,000 mL (1,000 mLs Intravenous New Bag/Given 09/26/19 0210)  sodium chloride 0.9 % bolus 1,000 mL (1,000 mLs Intravenous New Bag/Given 09/26/19 0210)  metoCLOPramide (REGLAN) injection 10 mg (10 mg Intravenous Given 09/26/19 0211)  diphenhydrAMINE (BENADRYL) injection 25 mg (25 mg Intravenous Given 09/26/19 0214)    ED Course  I have reviewed the triage vital signs and the nursing notes.  Pertinent labs & imaging results that were available during my care of the patient were reviewed by me and considered in my medical decision making (see chart for details).    MDM Rules/Calculators/A&P                      Patient was given migraine cocktail and IV fluids for dehydration.  Recheck at 3:00 AM patient has almost finished her 2 L of normal saline.  She states her headache is much improved after the migraine cocktail.  She has not had any more urinary output since she started the IV fluids.  She does not want any more IV fluids.  She states she does not have any medications for nausea at home, that will be provided at discharge.  Final Clinical Impression(s) / ED Diagnoses Final diagnoses:  Migraine with aura and without status migrainosus, not intractable  [redacted] weeks gestation of pregnancy  Non-intractable vomiting with nausea, unspecified vomiting type  Dehydration    Rx / DC Orders ED Discharge Orders         Ordered    ondansetron (ZOFRAN) 4 MG tablet  Every 8 hours PRN     09/26/19 0348         Plan discharge  Devoria Albe, MD, Concha Pyo, MD 09/26/19 [phone]

## 2019-09-26 NOTE — ED Triage Notes (Signed)
Pt sts she is [redacted] weeks pregnant and took first pill to end pregnancy. Since then has developed a headache and nausea denies vomiting or diarrhea.

## 2019-12-18 ENCOUNTER — Other Ambulatory Visit: Payer: Self-pay

## 2019-12-18 ENCOUNTER — Encounter (HOSPITAL_COMMUNITY): Payer: Self-pay

## 2019-12-18 DIAGNOSIS — R519 Headache, unspecified: Secondary | ICD-10-CM | POA: Insufficient documentation

## 2019-12-18 DIAGNOSIS — F419 Anxiety disorder, unspecified: Secondary | ICD-10-CM | POA: Insufficient documentation

## 2019-12-18 DIAGNOSIS — Z79899 Other long term (current) drug therapy: Secondary | ICD-10-CM | POA: Diagnosis not present

## 2019-12-18 DIAGNOSIS — R112 Nausea with vomiting, unspecified: Secondary | ICD-10-CM | POA: Diagnosis not present

## 2019-12-18 LAB — COMPREHENSIVE METABOLIC PANEL
ALT: 13 U/L (ref 0–44)
AST: 15 U/L (ref 15–41)
Albumin: 4.8 g/dL (ref 3.5–5.0)
Alkaline Phosphatase: 59 U/L (ref 38–126)
Anion gap: 11 (ref 5–15)
BUN: 15 mg/dL (ref 6–20)
CO2: 25 mmol/L (ref 22–32)
Calcium: 9.5 mg/dL (ref 8.9–10.3)
Chloride: 101 mmol/L (ref 98–111)
Creatinine, Ser: 0.83 mg/dL (ref 0.44–1.00)
GFR calc Af Amer: >60 mL/min (ref >60)
GFR calc non Af Amer: >60 mL/min (ref >60)
Glucose, Bld: 94 mg/dL (ref 70–99)
Potassium: 3.9 mmol/L (ref 3.5–5.1)
Sodium: 137 mmol/L (ref 135–145)
Total Bilirubin: 0.5 mg/dL (ref 0.3–1.2)
Total Protein: 7.8 g/dL (ref 6.5–8.1)

## 2019-12-18 LAB — LIPASE, BLOOD: Lipase: 35 U/L (ref 11–51)

## 2019-12-18 LAB — URINALYSIS, ROUTINE W REFLEX MICROSCOPIC
Bacteria, UA: NONE SEEN
Bilirubin Urine: NEGATIVE
Glucose, UA: NEGATIVE mg/dL
Hgb urine dipstick: NEGATIVE
Ketones, ur: 5 mg/dL — AB
Leukocytes,Ua: NEGATIVE
Nitrite: NEGATIVE
Protein, ur: 30 mg/dL — AB
Specific Gravity, Urine: 1.03 (ref 1.005–1.030)
pH: 5 (ref 5.0–8.0)

## 2019-12-18 LAB — CBC
HCT: 37.7 % (ref 36.0–46.0)
Hemoglobin: 12.2 g/dL (ref 12.0–15.0)
MCH: 27.9 pg (ref 26.0–34.0)
MCHC: 32.4 g/dL (ref 30.0–36.0)
MCV: 86.3 fL (ref 80.0–100.0)
Platelets: 430 10*3/uL — ABNORMAL HIGH (ref 150–400)
RBC: 4.37 MIL/uL (ref 3.87–5.11)
RDW: 14.8 % (ref 11.5–15.5)
WBC: 6.6 10*3/uL (ref 4.0–10.5)
nRBC: 0 % (ref 0.0–0.2)

## 2019-12-18 LAB — I-STAT BETA HCG BLOOD, ED (MC, WL, AP ONLY): I-stat hCG, quantitative: <5 m[IU]/mL (ref <5)

## 2019-12-18 MED ORDER — ONDANSETRON 4 MG PO TBDP
4.0000 mg | ORAL_TABLET | Freq: Once | ORAL | Status: AC | PRN
  Administered 2019-12-18: 4 mg via ORAL
  Filled 2019-12-18: qty 1

## 2019-12-18 NOTE — ED Triage Notes (Signed)
Patient c/o migraine 30-4- minutes after eating pork. Patient states she began having"floaters", and pressure behind her eyes and vomited x 2 today as well.

## 2019-12-19 ENCOUNTER — Emergency Department (HOSPITAL_COMMUNITY)
Admission: EM | Admit: 2019-12-19 | Discharge: 2019-12-19 | Disposition: A | Discharge status: Home / Self Care | Payer: Medicaid Other | Attending: Emergency Medicine | Admitting: Emergency Medicine

## 2019-12-19 DIAGNOSIS — R519 Headache, unspecified: Secondary | ICD-10-CM

## 2019-12-19 HISTORY — DX: Migraine, unspecified, not intractable, without status migrainosus: G43.909

## 2019-12-19 MED ORDER — ACETAMINOPHEN 500 MG PO TABS
1000.0000 mg | ORAL_TABLET | Freq: Once | ORAL | Status: AC
  Administered 2019-12-19: 1000 mg via ORAL
  Filled 2019-12-19: qty 2

## 2019-12-19 MED ORDER — KETOROLAC TROMETHAMINE 30 MG/ML IJ SOLN
30.0000 mg | Freq: Once | INTRAMUSCULAR | Status: AC
  Administered 2019-12-19: 30 mg via INTRAMUSCULAR
  Filled 2019-12-19: qty 1

## 2019-12-19 NOTE — ED Provider Notes (Signed)
[facility]-EMERGENCY DEPT Provider Note   CSN: [id number] Arrival date & time: 12/18/19  1546     History Chief Complaint  Patient presents with  . Migraine  . Blurred Vision  . Emesis    Kathleen Swanson is a 24 y.o. female.  Patient presents to the emergency department with a chief complaint of headache.  She states that headache occurred about 30 minutes after eating some pork earlier today (10 hours ago).  She states that she initially had some nausea, 2 episodes of vomiting, and had some slight blurred vision.  She states that all the symptoms have resolved.  She denies having had any numbness, weakness, tingling, loss of vision, or slurred speech.  She did not take anything for her symptoms.  She reports that she has history of anxiety.  Denies having had any allergies to work in the past, but states she has not eaten it in quite some time.  The history is provided by the patient. No language interpreter was used.       Past Medical History:  Diagnosis Date  . Anxiety   . Migraine     There are no problems to display for this patient.   Past Surgical History:  Procedure Laterality Date  . ADENOIDECTOMY    . CESAREAN SECTION    . WISDOM TOOTH EXTRACTION       OB History   No obstetric history on file.     History reviewed. No pertinent family history.  Social History   Tobacco Use  . Smoking status: Never Smoker  . Smokeless tobacco: Never Used  Vaping Use  . Vaping Use: Never used  Substance Use Topics  . Alcohol use: No  . Drug use: No    Home Medications Prior to Admission medications   Medication Sig Start Date End Date Taking? Authorizing Provider  hydrOXYzine (VISTARIL) 50 MG capsule Take 50 mg by mouth 2 (two) times daily as needed for anxiety.     [provider]  ondansetron (ZOFRAN) 4 MG tablet Take 1 tablet (4 mg total) by mouth every 8 (eight) hours as needed for nausea or vomiting. 09/26/19   Devoria Albe, MD    valACYclovir (VALTREX) 500 MG tablet Take 500 mg by mouth daily.    [provider]    Allergies    Patient has no known allergies.  Review of Systems   Review of Systems  All other systems reviewed and are negative.   Physical Exam Updated Vital Signs BP 117/76   Pulse (!) 57   Temp 98.6 F (37 C)   Resp 18   Ht 5\' 4"  (1.626 m)   Wt 63.5 kg   LMP 11/19/2019   SpO2 100%   BMI 24.03 kg/m   Physical Exam Vitals and nursing note reviewed.  Constitutional:      General: She is not in acute distress.    Appearance: She is well-developed.  HENT:     Head: Normocephalic and atraumatic.  Eyes:     Conjunctiva/sclera: Conjunctivae normal.  Neck:     Comments: No neck stiffness, no meningismus Cardiovascular:     Rate and Rhythm: Normal rate and regular rhythm.     Heart sounds: No murmur heard.   Pulmonary:     Effort: Pulmonary effort is normal. No respiratory distress.     Breath sounds: Normal breath sounds.  Abdominal:     Palpations: Abdomen is soft.     Tenderness: There is no  abdominal tenderness.  Musculoskeletal:        General: Normal range of motion.     Cervical back: Neck supple.  Skin:    General: Skin is warm and dry.  Neurological:     Mental Status: She is alert and oriented to person, place, and time.     Comments: CN III-XII intact, speech is clear, movements are goal oriented, normal finger-to-nose, sensation and strength of extremities is 5/5 throughout  Psychiatric:        Mood and Affect: Mood normal.        Behavior: Behavior normal.     ED Results / Procedures / Treatments   Labs (all labs ordered are listed, but only abnormal results are displayed) Labs Reviewed  CBC - Abnormal; Notable for the following components:      Result Value   Platelets 430 (*)    All other components within normal limits  URINALYSIS, ROUTINE W REFLEX MICROSCOPIC - Abnormal; Notable for the following components:   Ketones, ur 5 (*)    Protein,  ur 30 (*)    All other components within normal limits  LIPASE, BLOOD  COMPREHENSIVE METABOLIC PANEL  I-STAT BETA HCG BLOOD, ED ([facility], [facility], [facility] ONLY)    EKG None  Radiology No results found.  Procedures Procedures (including critical care time)  Medications Ordered in ED Medications  ondansetron (ZOFRAN-ODT) disintegrating tablet 4 mg (4 mg Oral Given 12/18/19 1635)  acetaminophen (TYLENOL) tablet 1,000 mg (1,000 mg Oral Given 12/19/19 0126)  ketorolac (TORADOL) 30 MG/ML injection 30 mg (30 mg Intramuscular Given 12/19/19 0126)    ED Course  I have reviewed the triage vital signs and the nursing notes.  Pertinent labs & imaging results that were available during my care of the patient were reviewed by me and considered in my medical decision making (see chart for details).    MDM Rules/Calculators/A&P                          Pt HA treated and improved while in ED.  Presentation atypical for [facility], [facility], Meningitis, or temporal arteritis. Pt is afebrile with no focal neuro deficits, nuchal rigidity, or persistent change in vision (was blurry initially and maybe had some floaters, but his has resolved). Pt is to follow up with PCP to discuss prophylactic medication. Pt verbalizes understanding and is agreeable with plan to dc.   Final Clinical Impression(s) / ED Diagnoses Final diagnoses:  Acute nonintractable headache, unspecified headache type    Rx / DC Orders ED Discharge Orders    None       Roxy Horseman, PA-C 12/19/19 0205    Shon Baton, MD 12/19/19 [phone]

## 2019-12-19 NOTE — ED Notes (Signed)
Pt verbalized dc instructions and follow up care. Alert and ambulatory. No iv. 

## 2020-01-25 ENCOUNTER — Other Ambulatory Visit: Payer: Self-pay

## 2020-01-25 ENCOUNTER — Encounter (HOSPITAL_COMMUNITY): Payer: Self-pay

## 2020-01-25 ENCOUNTER — Emergency Department (HOSPITAL_COMMUNITY)
Admission: EM | Admit: 2020-01-25 | Discharge: 2020-01-25 | Disposition: A | Discharge status: Home / Self Care | Payer: Medicaid Other | Attending: Emergency Medicine | Admitting: Emergency Medicine

## 2020-01-25 DIAGNOSIS — Z8669 Personal history of other diseases of the nervous system and sense organs: Secondary | ICD-10-CM | POA: Insufficient documentation

## 2020-01-25 DIAGNOSIS — R519 Headache, unspecified: Secondary | ICD-10-CM | POA: Diagnosis not present

## 2020-01-25 MED ORDER — SODIUM CHLORIDE 0.9 % IV BOLUS: 500.0000 mL | Freq: Once | INTRAVENOUS | Status: DC

## 2020-01-25 MED ORDER — SODIUM CHLORIDE 0.9 % IV BOLUS
500.0000 mL | Freq: Once | INTRAVENOUS | Status: AC
  Administered 2020-01-25: 500 mL via INTRAVENOUS

## 2020-01-25 MED ORDER — MAGNESIUM SULFATE 2 GM/50ML IV SOLN
2.0000 g | Freq: Once | INTRAVENOUS | Status: AC
  Administered 2020-01-25: 2 g via INTRAVENOUS
  Filled 2020-01-25: qty 50

## 2020-01-25 MED ORDER — ONDANSETRON HCL 4 MG/2ML IJ SOLN
4.0000 mg | Freq: Once | INTRAMUSCULAR | Status: AC
  Administered 2020-01-25: 4 mg via INTRAVENOUS
  Filled 2020-01-25: qty 2

## 2020-01-25 MED ORDER — METOCLOPRAMIDE HCL 5 MG/ML IJ SOLN: 10.0000 mg | Freq: Once | INTRAMUSCULAR | Status: DC

## 2020-01-25 NOTE — Discharge Instructions (Signed)
Thank you for allowing me to care for you today in the Emergency Department.   You were seen today for a migraine.  We discussed multiple medications that can be used to treat migraine and pregnancy and the risk versus benefits.  Please call and schedule a follow-up appointment with Dr. Lucia Gaskins with neurology.  She runs the headache clinic.  You can take 1000 g of Tylenol no more than once every 6-8 hours.  It is very important you do not take more than a fourth 1000 mg of Tylenol in a 24-hour period.   You can also discuss other medications for migraines with your OB/GYN at your next appointment.  Return to the emergency department if you develop new numbness or weakness, significant dizziness or lightheadedness, uncontrollable vomiting, slurred speech, facial droop, or other new, concerning symptoms.

## 2020-01-25 NOTE — ED Provider Notes (Signed)
[facility]-EMERGENCY DEPT Provider Note   CSN: [id number] Arrival date & time: 01/25/20  0002     History No chief complaint on file.   Kathleen Swanson is a 24 y.o. female ,G4P2, with a history of migraines and anxiety who presents to the emergency department with a chief complaint of headache.  The patient reports that she developed a right-sided posterior headache that has been constant since onset 3 days ago.  She rates the current headache as 8 out of 10 and characterized it as throbbing.  She is reporting associated bilateral blurred vision, nausea, and spots in her vision.  She has a history of migraines and was previously followed by neurology prior to moving to the area.  Today's headache feels similar to previous migraines.  She denies numbness, weakness, slurred speech, neck pain, fevers, chills, URI symptoms, chest pain, shortness of breath, amaurosis fugax, or diplopia.  She reports that she has been taking Tylenol for her symptoms at home, last dose earlier tonight without improvement in her symptoms.  LMP 11/25/2019.  She had a positive pregnancy test at her OB/GYN's office on 9/23. No abdominal pain or cramping, vaginal bleeding, or vaginal discharge.  The history is provided by the patient and medical records. No language interpreter was used.       Past Medical History:  Diagnosis Date  . Anxiety   . Migraine     There are no problems to display for this patient.   Past Surgical History:  Procedure Laterality Date  . ADENOIDECTOMY    . CESAREAN SECTION    . WISDOM TOOTH EXTRACTION       OB History    Gravida  4   Para  2   Term  2   Preterm  0   AB  1   Living  2     SAB  1   TAB      Ectopic  0   Multiple  0   Live Births              No family history on file.  Social History   Tobacco Use  . Smoking status: Never Smoker  . Smokeless tobacco: Never Used  Vaping Use  . Vaping Use: Never used  Substance  Use Topics  . Alcohol use: No  . Drug use: No    Home Medications Prior to Admission medications   Medication Sig Start Date End Date Taking? Authorizing Provider  hydrOXYzine (VISTARIL) 50 MG capsule Take 50 mg by mouth 2 (two) times daily as needed for anxiety.     [provider]  ondansetron (ZOFRAN) 4 MG tablet Take 1 tablet (4 mg total) by mouth every 8 (eight) hours as needed for nausea or vomiting. 09/26/19   Devoria Albe, MD  valACYclovir (VALTREX) 500 MG tablet Take 500 mg by mouth daily.    [provider]    Allergies    Patient has no known allergies.  Review of Systems   Review of Systems  Constitutional: Negative for activity change, chills and fever.  HENT: Negative for congestion and sore throat.   Eyes: Positive for visual disturbance (blurred).  Respiratory: Negative for shortness of breath and wheezing.   Cardiovascular: Negative for chest pain, palpitations and leg swelling.  Gastrointestinal: Positive for nausea. Negative for abdominal pain, diarrhea and vomiting.  Genitourinary: Negative for dysuria, pelvic pain, vaginal bleeding, vaginal discharge and vaginal pain.  Musculoskeletal: Negative for back pain, myalgias, neck pain  and neck stiffness.  Skin: Negative for rash.  Allergic/Immunologic: Negative for immunocompromised state.  Neurological: Positive for headaches. Negative for dizziness, seizures, syncope, weakness and numbness.  Psychiatric/Behavioral: Negative for confusion.    Physical Exam Updated Vital Signs BP 130/60 (BP Location: Right Arm)   Pulse 64   Temp 98.2 F (36.8 C)   Resp 17   LMP 11/25/2019   SpO2 100%   Physical Exam Vitals and nursing note reviewed.  Constitutional:      General: She is not in acute distress.    Appearance: She is not ill-appearing, toxic-appearing or diaphoretic.  HENT:     Head: Normocephalic.  Eyes:     Extraocular Movements: Extraocular movements intact.     Conjunctiva/sclera:  Conjunctivae normal.     Pupils: Pupils are equal, round, and reactive to light.  Cardiovascular:     Rate and Rhythm: Normal rate and regular rhythm.     Heart sounds: No murmur heard.  No friction rub. No gallop.   Pulmonary:     Effort: Pulmonary effort is normal. No respiratory distress.     Breath sounds: No stridor. No wheezing, rhonchi or rales.  Chest:     Chest wall: No tenderness.  Abdominal:     General: There is no distension.     Palpations: Abdomen is soft. There is no mass.     Tenderness: There is no abdominal tenderness. There is no right CVA tenderness, left CVA tenderness, guarding or rebound.     Hernia: No hernia is present.  Musculoskeletal:     Cervical back: Neck supple.  Skin:    General: Skin is warm.     Findings: No rash.  Neurological:     General: No focal deficit present.     Mental Status: She is alert.     Comments: Alert and oriented x3. GCS 15. Moves all 4 extremities spontaneously. Cranial nerves II through XII are grossly intact. Gait is not ataxic. Finger-to-nose is intact bilaterally.  Psychiatric:        Behavior: Behavior normal.     ED Results / Procedures / Treatments   Labs (all labs ordered are listed, but only abnormal results are displayed) Labs Reviewed - No data to display  EKG None  Radiology No results found.  Procedures Procedures (including critical care time)  Medications Ordered in ED Medications  sodium chloride 0.9 % bolus 500 mL (0 mLs Intravenous Stopped 01/25/20 0155)  magnesium sulfate IVPB 2 g 50 mL (0 g Intravenous Stopped 01/25/20 0156)  ondansetron (ZOFRAN) injection 4 mg (4 mg Intravenous Given 01/25/20 0111)    ED Course  I have reviewed the triage vital signs and the nursing notes.  Pertinent labs & imaging results that were available during my care of the patient were reviewed by me and considered in my medical decision making (see chart for details).    MDM Rules/Calculators/A&P                           24 year old G45P2 female with a history of anxiety and migraines presenting with a right-sided posterior headache for the last 3 days accompanied by blurred vision, nausea, and spots in her vision. She has been taking Tylenol at home without improvement. She has a positive pregnancy test at her OB/GYN's office on September 23. Last menstrual cycle noted to be around August 7.  Vital signs are stable. She has no neurologic deficits. No low suspicion for  COVID-19, CVA, meningitis. This sounds similar to patient's previous migraines and will plan to treat as such.   Initially planned to treat the patient's symptoms with Compazine and Benadryl given recent positive pregnancy test. However, patient has her 2 children with her at bedside, and she drove herself to the ER. We discussed the sedating effect that Compazine and Benadryl can have, which I do not feel comfortable giving her in the emergency department knowing that she has to drive and has her 2 children with her. She does not have another individual who can pick her up from the ER at this time. Shared decision-making conversation with the patient at bedside including multiple other medications and risk versus benefits including Reglan without Benadryl and concerns for tardive dyskinesia, sumatriptan, avoiding NSAIDs. After lengthy discussion, patient was agreeable to trialing IV fluids, Zofran, magnesium sulfate, which is technically third line therapy for migraine and pregnancy, but given safety profile of the medication.   On reevaluation, she reports headache was improving, but not resolved. However, she felt that at this time her blurred vision and spots in her vision had resolved, and she did not want to try any other interventions at this time. I will provide her with a referral to neurology since she recently moved to the area. She was also advised that if she has another ride home she can return and attempt another migraine cocktail. All  questions were answered and she was agreeable with this plan at this time. She is hemodynamically stable to no acute distress. Advised the patient to follow closely with OB/GYN. Safe for discharge home with outpatient follow-up.    Final Clinical Impression(s) / ED Diagnoses Final diagnoses:  Bad headache    Rx / DC Orders ED Discharge Orders    None       Barkley Boards, PA-C 01/25/20 0320    Mesner, Barbara Cower, MD 01/25/20 [phone]

## 2020-01-25 NOTE — ED Triage Notes (Signed)
Patient arrived with complaints of a headache over the last three days that has not resolved with OTC medications. Reporting spots in her vision.

## 2020-02-16 ENCOUNTER — Encounter (HOSPITAL_COMMUNITY): Payer: Self-pay

## 2020-02-16 ENCOUNTER — Other Ambulatory Visit: Payer: Self-pay

## 2020-02-16 DIAGNOSIS — L509 Urticaria, unspecified: Secondary | ICD-10-CM | POA: Diagnosis not present

## 2020-02-16 DIAGNOSIS — Z3A00 Weeks of gestation of pregnancy not specified: Secondary | ICD-10-CM | POA: Diagnosis not present

## 2020-02-16 DIAGNOSIS — O26891 Other specified pregnancy related conditions, first trimester: Secondary | ICD-10-CM | POA: Insufficient documentation

## 2020-02-16 NOTE — ED Triage Notes (Signed)
Pt arrives with c/o anxious feeling due to possible allergic reaction. Pt says she has been having hives and elevated areas on face for about a day. Taking benadryl approx 1 hour pta.

## 2020-02-17 ENCOUNTER — Emergency Department (HOSPITAL_COMMUNITY)
Admission: EM | Admit: 2020-02-17 | Discharge: 2020-02-17 | Disposition: A | Discharge status: Home / Self Care | Payer: Medicaid Other | Attending: Emergency Medicine | Admitting: Emergency Medicine

## 2020-02-17 DIAGNOSIS — L509 Urticaria, unspecified: Secondary | ICD-10-CM

## 2020-02-17 MED ORDER — CETIRIZINE HCL 10 MG PO TABS
10.0000 mg | ORAL_TABLET | Freq: Every day | ORAL | 0 refills | Status: DC
Start: 2020-02-17 — End: 2023-12-06

## 2020-02-17 NOTE — ED Provider Notes (Signed)
[facility]-EMERGENCY DEPT Provider Note   CSN: [id number] Arrival date & time: 02/16/20  2337     History Chief Complaint  Patient presents with  . Allergic Reaction    Kathleen Swanson is a 24 y.o. female.  Patient presents to the emergency department with a chief complaint of hives.  She states that she has been having bumps breaking out on her face and cheeks for the past couple of days.  She has been taking Benadryl.  The patient denies fevers or chills.  Denies any throat swelling sensation or trouble swallowing.  Denies any CP, SOB, n/v/d.  Denies an known allergies.  She is approx [redacted] weeks pregnant.  The history is provided by the patient. No language interpreter was used.       Past Medical History:  Diagnosis Date  . Anxiety   . Migraine     There are no problems to display for this patient.   Past Surgical History:  Procedure Laterality Date  . ADENOIDECTOMY    . CESAREAN SECTION    . WISDOM TOOTH EXTRACTION       OB History    Gravida  4   Para  2   Term  2   Preterm  0   AB  1   Living  2     SAB  1   TAB      Ectopic  0   Multiple  0   Live Births              No family history on file.  Social History   Tobacco Use  . Smoking status: Never Smoker  . Smokeless tobacco: Never Used  Vaping Use  . Vaping Use: Never used  Substance Use Topics  . Alcohol use: No  . Drug use: No    Home Medications Prior to Admission medications   Medication Sig Start Date End Date Taking? Authorizing Provider  cetirizine (ZYRTEC ALLERGY) 10 MG tablet Take 1 tablet (10 mg total) by mouth daily. 02/17/20   Roxy Horseman, PA-C  hydrOXYzine (VISTARIL) 50 MG capsule Take 50 mg by mouth 2 (two) times daily as needed for anxiety.     [provider]  ondansetron (ZOFRAN) 4 MG tablet Take 1 tablet (4 mg total) by mouth every 8 (eight) hours as needed for nausea or vomiting. 09/26/19   Devoria Albe, MD  valACYclovir  (VALTREX) 500 MG tablet Take 500 mg by mouth daily.    [provider]    Allergies    Metronidazole  Review of Systems   Review of Systems  All other systems reviewed and are negative.   Physical Exam Updated Vital Signs BP 120/73 (BP Location: Left Arm)   Pulse 74   Temp 98.1 F (36.7 C) (Oral)   Resp 16   Ht 5\' 4"  (1.626 m)   Wt 63.5 kg   LMP 11/25/2019   SpO2 100%   BMI 24.03 kg/m   Physical Exam Vitals and nursing note reviewed.  Constitutional:      General: She is not in acute distress.    Appearance: She is well-developed.  HENT:     Head: Normocephalic and atraumatic.     Comments: No visible hives No throat swelling Normal phonation Eyes:     Conjunctiva/sclera: Conjunctivae normal.  Cardiovascular:     Rate and Rhythm: Normal rate and regular rhythm.     Heart sounds: No murmur heard.   Pulmonary:  Effort: Pulmonary effort is normal. No respiratory distress.     Breath sounds: Normal breath sounds.     Comments: CTAB Abdominal:     Palpations: Abdomen is soft.     Tenderness: There is no abdominal tenderness.  Musculoskeletal:     Cervical back: Neck supple.  Skin:    General: Skin is warm and dry.     Comments: No visible hives.  Neurological:     Mental Status: She is alert and oriented to person, place, and time.  Psychiatric:        Mood and Affect: Mood normal.        Behavior: Behavior normal.     ED Results / Procedures / Treatments   Labs (all labs ordered are listed, but only abnormal results are displayed) Labs Reviewed - No data to display  EKG None  Radiology No results found.  Procedures Procedures (including critical care time)  Medications Ordered in ED Medications - No data to display  ED Course  I have reviewed the triage vital signs and the nursing notes.  Pertinent labs & imaging results that were available during my care of the patient were reviewed by me and considered in my medical decision  making (see chart for details).    MDM Rules/Calculators/A&P                           Patient here with hives.  She is about [redacted] weeks pregnant.  She denies fever, throat swelling, wheezing, sob, n/v/d.    She is in no acute distress.  She doesn't have any hives presently.  No evidence of anaphylaxis.  Recommend Zyrtec and allergist follow-up.    Final Clinical Impression(s) / ED Diagnoses Final diagnoses:  Urticaria    Rx / DC Orders ED Discharge Orders         Ordered    cetirizine (ZYRTEC ALLERGY) 10 MG tablet  Daily        02/17/20 0203           Roxy Horseman, PA-C 02/17/20 9470    Gilda Crease, MD 02/17/20 [phone]

## 2020-03-16 ENCOUNTER — Emergency Department (HOSPITAL_COMMUNITY)
Admission: EM | Admit: 2020-03-16 | Discharge: 2020-03-16 | Disposition: A | Discharge status: Home / Self Care | Payer: Medicaid Other | Attending: Emergency Medicine | Admitting: Emergency Medicine

## 2020-03-16 ENCOUNTER — Encounter (HOSPITAL_COMMUNITY): Payer: Self-pay | Admitting: Emergency Medicine

## 2020-03-16 ENCOUNTER — Other Ambulatory Visit: Payer: Self-pay

## 2020-03-16 DIAGNOSIS — K0889 Other specified disorders of teeth and supporting structures: Secondary | ICD-10-CM | POA: Insufficient documentation

## 2020-03-16 MED ORDER — AMOXICILLIN 500 MG PO CAPS
500.0000 mg | ORAL_CAPSULE | Freq: Three times a day (TID) | ORAL | 0 refills | Status: DC
Start: 2020-03-16 — End: 2021-04-04

## 2020-03-16 MED ORDER — AMOXICILLIN 500 MG PO CAPS
500.0000 mg | ORAL_CAPSULE | Freq: Once | ORAL | Status: AC
  Administered 2020-03-16: 500 mg via ORAL
  Filled 2020-03-16: qty 1

## 2020-03-16 NOTE — Discharge Instructions (Signed)
Follow-up with a dentist.  Only a dentist can fix your problem.  We recommend that you take amoxicillin as prescribed to treat likely underlying infection.  Take ibuprofen or Tylenol for pain control.  Return to the ED for any new or concerning symptoms.

## 2020-03-16 NOTE — ED Triage Notes (Signed)
Patient presents with dental pain r/t a possible infection. Patient states she called her dentist who could not get her in for another week. Patient states pain radiates up her jaw and behind her ear. No swelling noted. Patient denies fevers, nausea and vomiting.

## 2020-03-17 NOTE — ED Provider Notes (Signed)
[facility]-EMERGENCY DEPT Provider Note   CSN: [id number] Arrival date & time: 03/16/20  2304     History Chief Complaint  Patient presents with  . Dental Pain    Kathleen Swanson is a 24 y.o. female.  The history is provided by the patient. No language interpreter was used.  Dental Pain Location:  Lower Lower teeth location:  29/RL 2nd bicuspid Quality:  Throbbing Severity:  Mild Onset quality:  Gradual Duration:  2 days Timing:  Constant Progression:  Worsening Chronicity:  New Context: dental caries   Context: not trauma   Relieved by:  Nothing Ineffective treatments:  Acetaminophen Associated symptoms: facial pain   Associated symptoms: no difficulty swallowing, no drooling, no facial swelling, no fever, no oral bleeding, no oral lesions and no trismus   Risk factors: no immunosuppression        Past Medical History:  Diagnosis Date  . Anxiety   . Migraine     There are no problems to display for this patient.   Past Surgical History:  Procedure Laterality Date  . ADENOIDECTOMY    . CESAREAN SECTION    . WISDOM TOOTH EXTRACTION       OB History    Gravida  4   Para  2   Term  2   Preterm  0   AB  1   Living  2     SAB  1   TAB      Ectopic  0   Multiple  0   Live Births              No family history on file.  Social History   Tobacco Use  . Smoking status: Never Smoker  . Smokeless tobacco: Never Used  Vaping Use  . Vaping Use: Never used  Substance Use Topics  . Alcohol use: No  . Drug use: No    Home Medications Prior to Admission medications   Medication Sig Start Date End Date Taking? Authorizing Provider  amoxicillin (AMOXIL) 500 MG capsule Take 1 capsule (500 mg total) by mouth 3 (three) times daily. 03/16/20   Antony Madura, PA-C  cetirizine (ZYRTEC ALLERGY) 10 MG tablet Take 1 tablet (10 mg total) by mouth daily. 02/17/20   Roxy Horseman, PA-C  hydrOXYzine (VISTARIL) 50 MG capsule  Take 50 mg by mouth 2 (two) times daily as needed for anxiety.     [provider]  ondansetron (ZOFRAN) 4 MG tablet Take 1 tablet (4 mg total) by mouth every 8 (eight) hours as needed for nausea or vomiting. 09/26/19   Devoria Albe, MD  valACYclovir (VALTREX) 500 MG tablet Take 500 mg by mouth daily.    [provider]    Allergies    Metronidazole  Review of Systems   Review of Systems  Constitutional: Negative for fever.  HENT: Positive for dental problem. Negative for drooling, facial swelling and mouth sores.   Ten systems reviewed and are negative for acute change, except as noted in the HPI.    Physical Exam Updated Vital Signs BP (!) 144/84 (BP Location: Left Arm)   Pulse (!) 109   Temp 98.2 F (36.8 C)   Resp 16   LMP 11/25/2019   SpO2 100%   Physical Exam Vitals and nursing note reviewed.  Constitutional:      General: She is not in acute distress.    Appearance: She is well-developed. She is not diaphoretic.     Comments: Nontoxic  appearing and in NAD  HENT:     Head: Normocephalic and atraumatic.     Mouth/Throat:     Mouth: Mucous membranes are moist.     Dentition: Dental tenderness and dental caries present. No dental abscesses.      Comments: Soft oral floor.  No trismus or malocclusion.  Tolerating secretions without difficulty.  No gingival swelling or fluctuance. Eyes:     General: No scleral icterus.    Conjunctiva/sclera: Conjunctivae normal.  Neck:     Comments: No meningismus Pulmonary:     Effort: Pulmonary effort is normal. No respiratory distress.  Musculoskeletal:        General: Normal range of motion.     Cervical back: Normal range of motion.  Skin:    General: Skin is warm and dry.     Coloration: Skin is not pale.     Findings: No erythema or rash.  Neurological:     Mental Status: She is alert and oriented to person, place, and time.  Psychiatric:        Behavior: Behavior normal.     ED Results / Procedures /  Treatments   Labs (all labs ordered are listed, but only abnormal results are displayed) Labs Reviewed - No data to display  EKG None  Radiology No results found.  Procedures Procedures (including critical care time)  Medications Ordered in ED Medications  amoxicillin (AMOXIL) capsule 500 mg (500 mg Oral Given 03/16/20 2354)    ED Course  I have reviewed the triage vital signs and the nursing notes.  Pertinent labs & imaging results that were available during my care of the patient were reviewed by me and considered in my medical decision making (see chart for details).    MDM Rules/Calculators/A&P                          Patient with toothache.  No gross abscess.  Exam unconcerning for Ludwig's angina or spread of infection.  Will treat with Amoxicillin and pain medicine.  Urged patient to follow-up with dentist.  Return precautions discussed and provided. Patient discharged in stable condition with no unaddressed concerns.   Final Clinical Impression(s) / ED Diagnoses Final diagnoses:  Dentalgia    Rx / DC Orders ED Discharge Orders         Ordered    amoxicillin (AMOXIL) 500 MG capsule  3 times daily        03/16/20 2335           Antony Madura, PA-C 03/17/20 0144    Ward, Layla Maw, DO 03/17/20 0149

## 2020-03-25 DIAGNOSIS — A6000 Herpesviral infection of urogenital system, unspecified: Secondary | ICD-10-CM | POA: Insufficient documentation

## 2020-03-29 DIAGNOSIS — R87612 Low grade squamous intraepithelial lesion on cytologic smear of cervix (LGSIL): Secondary | ICD-10-CM | POA: Insufficient documentation

## 2020-04-12 ENCOUNTER — Emergency Department (HOSPITAL_COMMUNITY)
Admission: EM | Admit: 2020-04-12 | Discharge: 2020-04-12 | Disposition: A | Discharge status: Home / Self Care | Payer: Medicaid Other | Attending: Emergency Medicine | Admitting: Emergency Medicine

## 2020-04-12 ENCOUNTER — Encounter (HOSPITAL_COMMUNITY): Payer: Self-pay | Admitting: Emergency Medicine

## 2020-04-12 DIAGNOSIS — R0789 Other chest pain: Secondary | ICD-10-CM | POA: Diagnosis not present

## 2020-04-12 DIAGNOSIS — Z3A20 20 weeks gestation of pregnancy: Secondary | ICD-10-CM | POA: Diagnosis not present

## 2020-04-12 DIAGNOSIS — O99342 Other mental disorders complicating pregnancy, second trimester: Secondary | ICD-10-CM | POA: Insufficient documentation

## 2020-04-12 DIAGNOSIS — O99012 Anemia complicating pregnancy, second trimester: Secondary | ICD-10-CM | POA: Insufficient documentation

## 2020-04-12 DIAGNOSIS — R8271 Bacteriuria: Secondary | ICD-10-CM | POA: Insufficient documentation

## 2020-04-12 DIAGNOSIS — O99891 Other specified diseases and conditions complicating pregnancy: Secondary | ICD-10-CM

## 2020-04-12 DIAGNOSIS — F329 Major depressive disorder, single episode, unspecified: Secondary | ICD-10-CM | POA: Diagnosis not present

## 2020-04-12 DIAGNOSIS — D649 Anemia, unspecified: Secondary | ICD-10-CM

## 2020-04-12 DIAGNOSIS — O26892 Other specified pregnancy related conditions, second trimester: Secondary | ICD-10-CM | POA: Diagnosis present

## 2020-04-12 DIAGNOSIS — F32A Depression, unspecified: Secondary | ICD-10-CM

## 2020-04-12 LAB — COMPREHENSIVE METABOLIC PANEL
ALT: 17 U/L (ref 0–44)
AST: 11 U/L — ABNORMAL LOW (ref 15–41)
Albumin: 3.4 g/dL — ABNORMAL LOW (ref 3.5–5.0)
Alkaline Phosphatase: 55 U/L (ref 38–126)
Anion gap: 9 (ref 5–15)
BUN: 7 mg/dL (ref 6–20)
CO2: 22 mmol/L (ref 22–32)
Calcium: 8.4 mg/dL — ABNORMAL LOW (ref 8.9–10.3)
Chloride: 103 mmol/L (ref 98–111)
Creatinine, Ser: 0.4 mg/dL — ABNORMAL LOW (ref 0.44–1.00)
GFR, Estimated: >60 mL/min (ref >60)
Glucose, Bld: 90 mg/dL (ref 70–99)
Potassium: 3.5 mmol/L (ref 3.5–5.1)
Sodium: 134 mmol/L — ABNORMAL LOW (ref 135–145)
Total Bilirubin: 0.4 mg/dL (ref 0.3–1.2)
Total Protein: 6.3 g/dL — ABNORMAL LOW (ref 6.5–8.1)

## 2020-04-12 LAB — URINALYSIS, ROUTINE W REFLEX MICROSCOPIC
Bilirubin Urine: NEGATIVE
Glucose, UA: NEGATIVE mg/dL
Hgb urine dipstick: NEGATIVE
Ketones, ur: NEGATIVE mg/dL
Nitrite: NEGATIVE
Protein, ur: NEGATIVE mg/dL
Specific Gravity, Urine: 1.012 (ref 1.005–1.030)
pH: 7 (ref 5.0–8.0)

## 2020-04-12 LAB — CBC
HCT: 29.5 % — ABNORMAL LOW (ref 36.0–46.0)
Hemoglobin: 9.6 g/dL — ABNORMAL LOW (ref 12.0–15.0)
MCH: 28.7 pg (ref 26.0–34.0)
MCHC: 32.5 g/dL (ref 30.0–36.0)
MCV: 88.1 fL (ref 80.0–100.0)
Platelets: 363 10*3/uL (ref 150–400)
RBC: 3.35 MIL/uL — ABNORMAL LOW (ref 3.87–5.11)
RDW: 13.4 % (ref 11.5–15.5)
WBC: 9 10*3/uL (ref 4.0–10.5)
nRBC: 0 % (ref 0.0–0.2)

## 2020-04-12 LAB — LIPASE, BLOOD: Lipase: 44 U/L (ref 11–51)

## 2020-04-12 LAB — HCG, QUANTITATIVE, PREGNANCY: hCG, Beta Chain, Quant, S: 32878 m[IU]/mL — ABNORMAL HIGH (ref <5)

## 2020-04-12 MED ORDER — NITROFURANTOIN MONOHYD MACRO 100 MG PO CAPS: 100.0000 mg | ORAL_CAPSULE | Freq: Two times a day (BID) | ORAL | 0 refills | Status: AC

## 2020-04-12 MED ORDER — NITROFURANTOIN MONOHYD MACRO 100 MG PO CAPS: 100.0000 mg | ORAL_CAPSULE | Freq: Two times a day (BID) | ORAL | 0 refills | Status: DC

## 2020-04-12 NOTE — ED Triage Notes (Addendum)
Patient here from home reporting anxiety due to recent death in family. Also states she is [redacted] weeks pregnant and is having abd cramping. Denies n/v. No bleeding.

## 2020-04-12 NOTE — Discharge Instructions (Addendum)
Thank you for allowing me to care for you today in the Emergency Department.   You can follow-up with [facility] area if you are feeling more depressed or anxious.  You can also follow-up with your OB/GYN.  Your hemoglobin level was low today, but minimally changed from your OB appointment earlier this month.  Your OB/GYN can follow your blood count.  Your urine showed bacteria in it.  This is treated in pregnancy with antibiotics.  Take 1 tablet of nitrofurantoin 2 times daily for the next 5 days.  Return to the emergency department if you having thoughts of wanting to kill yourself or others, if you start having hallucinations, difficulty breathing, chest pain with sweating, leg pain and swelling, uncontrollable vomiting, or other new, concerning symptoms.

## 2020-04-12 NOTE — ED Provider Notes (Signed)
[facility]-EMERGENCY DEPT Provider Note   CSN: [id number] Arrival date & time: 04/12/20  0215     History Chief Complaint  Patient presents with  . Anxiety  . Abdominal Cramping    Kathleen Swanson is a 24 y.o. female who is [redacted]w[redacted]d with an EDD 08/29/20 who presents the emergency department who presents to the emergency department with a chief complaint chest pain.  The patient reports that her grandmother passed away on 04/29/2023.  She reports that she has had a very difficult time dealing with her passing.  She reports that around 1 AM she was laying awake and ruminating about her grandmother when she suddenly developed chest pain and tightness with some shortness of breath.  She notes that she was feeling much more anxious at the time.  She denies diaphoresis, numbness, weakness, headache, visual changes, dizziness, lightheadedness, nausea, vomiting, diarrhea.  She later developed some mild lower abdominal cramping, but this is since subsided.  She denies dysuria, hematuria, rash, palpitations, leg swelling.  No recent long travel.  Immobilization.  No personal or familial history of VTE.  No vaginal bleeding, melena, hematochezia, URI symptoms, or epistaxis.  No treatment prior to arrival.  She denies SI, HI, auditory visualizations.  Reports that she used to be on a depressant and has a history of anxiety and depression, but is no longer taking this medication because she is pregnant.  The history is provided by the patient and medical records. No language interpreter was used.       Past Medical History:  Diagnosis Date  . Anxiety   . Migraine     There are no problems to display for this patient.   Past Surgical History:  Procedure Laterality Date  . ADENOIDECTOMY    . CESAREAN SECTION    . WISDOM TOOTH EXTRACTION       OB History    Gravida  4   Para  2   Term  2   Preterm  0   AB  1   Living  2     SAB  1   IAB      Ectopic  0    Multiple  0   Live Births              No family history on file.  Social History   Tobacco Use  . Smoking status: Never Smoker  . Smokeless tobacco: Never Used  Vaping Use  . Vaping Use: Never used  Substance Use Topics  . Alcohol use: No  . Drug use: No    Home Medications Prior to Admission medications   Medication Sig Start Date End Date Taking? Authorizing Provider  amoxicillin (AMOXIL) 500 MG capsule Take 1 capsule (500 mg total) by mouth 3 (three) times daily. 03/16/20   Antony Madura, PA-C  cetirizine (ZYRTEC ALLERGY) 10 MG tablet Take 1 tablet (10 mg total) by mouth daily. 02/17/20   Roxy Horseman, PA-C  hydrOXYzine (VISTARIL) 50 MG capsule Take 50 mg by mouth 2 (two) times daily as needed for anxiety.     [provider]  nitrofurantoin, macrocrystal-monohydrate, (MACROBID) 100 MG capsule Take 1 capsule (100 mg total) by mouth 2 (two) times daily for 5 days. 04/12/20 04/17/20  Arshia Rondon A, PA-C  ondansetron (ZOFRAN) 4 MG tablet Take 1 tablet (4 mg total) by mouth every 8 (eight) hours as needed for nausea or vomiting. 09/26/19   Devoria Albe, MD  valACYclovir (VALTREX) 500 MG tablet  Take 500 mg by mouth daily.    [provider]    Allergies    Metronidazole  Review of Systems   Review of Systems  Constitutional: Negative for activity change, chills, diaphoresis and fever.  HENT: Negative for congestion and sore throat.   Eyes: Negative for visual disturbance.  Respiratory: Positive for shortness of breath. Negative for cough and wheezing.   Cardiovascular: Positive for chest pain. Negative for palpitations.  Gastrointestinal: Positive for abdominal pain. Negative for anal bleeding, blood in stool, diarrhea, nausea and vomiting.  Genitourinary: Negative for dysuria, urgency and vaginal pain.  Musculoskeletal: Negative for back pain.  Skin: Negative for rash.  Allergic/Immunologic: Negative for immunocompromised state.  Neurological:  Negative for dizziness, seizures, syncope, weakness, numbness and headaches.  Psychiatric/Behavioral: Negative for confusion. The patient is nervous/anxious.     Physical Exam Updated Vital Signs BP 102/72   Pulse (!) 46   Temp 98.3 F (36.8 C) (Oral)   Resp 13   Ht 5\' 4"  (1.626 m)   Wt 63.5 kg   LMP 11/25/2019   SpO2 99%   BMI 24.03 kg/m   Physical Exam Vitals and nursing note reviewed.  Constitutional:      General: She is not in acute distress.    Appearance: She is not ill-appearing, toxic-appearing or diaphoretic.  HENT:     Head: Normocephalic.  Eyes:     Conjunctiva/sclera: Conjunctivae normal.  Cardiovascular:     Rate and Rhythm: Normal rate and regular rhythm.     Heart sounds: No murmur heard. No friction rub. No gallop.      Comments: Peripheral pulses are 2+ and symmetric. Pulmonary:     Effort: Pulmonary effort is normal. No respiratory distress.     Breath sounds: No stridor. No wheezing, rhonchi or rales.  Chest:     Chest wall: No tenderness.  Abdominal:     General: There is no distension.     Palpations: Abdomen is soft. There is no mass.     Tenderness: There is no abdominal tenderness. There is no right CVA tenderness, left CVA tenderness, guarding or rebound.     Hernia: No hernia is present.     Comments: Abdomen is soft, nontender, nondistended.  Musculoskeletal:     Cervical back: Neck supple.     Right lower leg: No edema.     Left lower leg: No edema.  Skin:    General: Skin is warm.     Coloration: Skin is not jaundiced or pale.     Findings: No rash.  Neurological:     General: No focal deficit present.     Mental Status: She is alert.  Psychiatric:        Mood and Affect: Mood is depressed.        Behavior: Behavior normal.     ED Results / Procedures / Treatments   Labs (all labs ordered are listed, but only abnormal results are displayed) Labs Reviewed  COMPREHENSIVE METABOLIC PANEL - Abnormal; Notable for the following  components:      Result Value   Sodium 134 (*)    Creatinine, Ser 0.40 (*)    Calcium 8.4 (*)    Total Protein 6.3 (*)    Albumin 3.4 (*)    AST 11 (*)    All other components within normal limits  CBC - Abnormal; Notable for the following components:   RBC 3.35 (*)    Hemoglobin 9.6 (*)    HCT 29.5 (*)  All other components within normal limits  URINALYSIS, ROUTINE W REFLEX MICROSCOPIC - Abnormal; Notable for the following components:   APPearance HAZY (*)    Leukocytes,Ua MODERATE (*)    Bacteria, UA RARE (*)    All other components within normal limits  HCG, QUANTITATIVE, PREGNANCY - Abnormal; Notable for the following components:   hCG, Beta Chain, Kathleen Swanson 32,878 (*)    All other components within normal limits  LIPASE, BLOOD    EKG EKG Interpretation  Date/Time:  Friday April 12 2020 05:12:42 EST Ventricular Rate:  63 PR Interval:    QRS Duration: 97 QT Interval:  409 QTC Calculation: 419 R Axis:   43 Text Interpretation: Sinus arrhythmia RSR' in V1 or V2, probably normal variant No significant change was found Confirmed by Paula Libra ([id number]) on 04/12/2020 5:51:34 AM   Radiology No results found.  Procedures Procedures (including critical care time)  Medications Ordered in ED Medications - No data to display  ED Course  I have reviewed the triage vital signs and the nursing notes.  Pertinent labs & imaging results that were available during my care of the patient were reviewed by me and considered in my medical decision making (see chart for details).    MDM Rules/Calculators/A&P                          24 year old female who is [redacted]w[redacted]d with an EDD 08/29/20 who presents the emergency department with chest pain and shortness of breath that began while she was ruminating about her grandmother who passed away on 05/04/2023.  She also had a brief episode of some lower abdominal cramping, but this is since subsided.  No chest pain or shortness of breath  have also subsided.  Initial blood pressure 98/56, improving to 107/53.  No tachycardia.  Afebrile.  No tachypnea, hypoxia.  Labs have been reviewed and independently interpreted by me.  Hemoglobin is 9.6.  This is down from 12.2 in August.  This could be delusional anemia secondary to pregnancy.  She did have labs drawn earlier this month at [facility] with hemoglobin approximately 10.8.  She is having no active bleeding.  She is having no signs or symptoms of symptomatic anemia.  UA with asymptomatic bacteriuria.  No other significant changes on her labs.  The patient was discussed with Dr. Read Drivers continue physician to discuss ordering troponin given chest pain shortness of breath since patient is pregnant.  EKG was obtained with sinus arrhythmia.  Does not recommend ordering a troponin.  He also has a low suspicion for PE given the patient's presentation.  I do think that her symptoms are most likely due to depression and anxiety that she may have an adjustment disorder, but doubt a panic attack  We will discharge with nitrofurantoin for asymptomatic bacteriuria.  She has been advised to follow-up with her OB/GYN for anemia.  She will be given a referral to [facility].  ER return precautions given.  She is hemodynamically stable and in no acute distress.  Safe for discharge home with outpatient follow-up as indicated.  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

## 2020-04-19 ENCOUNTER — Other Ambulatory Visit: Payer: Self-pay

## 2020-04-19 ENCOUNTER — Emergency Department (HOSPITAL_COMMUNITY)
Admission: EM | Admit: 2020-04-19 | Discharge: 2020-04-20 | Disposition: A | Discharge status: Home / Self Care | Payer: Medicaid Other | Attending: Emergency Medicine | Admitting: Emergency Medicine

## 2020-04-19 ENCOUNTER — Encounter (HOSPITAL_COMMUNITY): Payer: Self-pay | Admitting: Emergency Medicine

## 2020-04-19 DIAGNOSIS — Z3A20 20 weeks gestation of pregnancy: Secondary | ICD-10-CM | POA: Insufficient documentation

## 2020-04-19 DIAGNOSIS — Z5321 Procedure and treatment not carried out due to patient leaving prior to being seen by health care provider: Secondary | ICD-10-CM | POA: Insufficient documentation

## 2020-04-19 DIAGNOSIS — R0981 Nasal congestion: Secondary | ICD-10-CM | POA: Diagnosis not present

## 2020-04-19 DIAGNOSIS — R059 Cough, unspecified: Secondary | ICD-10-CM | POA: Diagnosis not present

## 2020-04-19 DIAGNOSIS — J029 Acute pharyngitis, unspecified: Secondary | ICD-10-CM | POA: Diagnosis not present

## 2020-04-19 DIAGNOSIS — O99512 Diseases of the respiratory system complicating pregnancy, second trimester: Secondary | ICD-10-CM | POA: Diagnosis present

## 2020-04-19 NOTE — ED Triage Notes (Signed)
Per pt, states her mother tested positive for covid yesterday-states she has been coughing, sore throat and congestion for 2-3 days-states she is [redacted] weeks pregnant

## 2020-04-23 ENCOUNTER — Encounter (HOSPITAL_COMMUNITY): Payer: Self-pay

## 2020-04-23 ENCOUNTER — Emergency Department (HOSPITAL_COMMUNITY): Payer: Medicaid Other

## 2020-04-23 ENCOUNTER — Other Ambulatory Visit: Payer: Self-pay

## 2020-04-23 DIAGNOSIS — R0602 Shortness of breath: Secondary | ICD-10-CM | POA: Insufficient documentation

## 2020-04-23 DIAGNOSIS — R630 Anorexia: Secondary | ICD-10-CM | POA: Diagnosis not present

## 2020-04-23 DIAGNOSIS — R5383 Other fatigue: Secondary | ICD-10-CM | POA: Diagnosis not present

## 2020-04-23 DIAGNOSIS — R519 Headache, unspecified: Secondary | ICD-10-CM | POA: Insufficient documentation

## 2020-04-23 DIAGNOSIS — Z5321 Procedure and treatment not carried out due to patient leaving prior to being seen by health care provider: Secondary | ICD-10-CM | POA: Diagnosis not present

## 2020-04-23 LAB — BASIC METABOLIC PANEL
Anion gap: 9 (ref 5–15)
BUN: 7 mg/dL (ref 6–20)
CO2: 20 mmol/L — ABNORMAL LOW (ref 22–32)
Calcium: 8.4 mg/dL — ABNORMAL LOW (ref 8.9–10.3)
Chloride: 106 mmol/L (ref 98–111)
Creatinine, Ser: 0.47 mg/dL (ref 0.44–1.00)
GFR, Estimated: >60 mL/min (ref >60)
Glucose, Bld: 86 mg/dL (ref 70–99)
Potassium: 3.8 mmol/L (ref 3.5–5.1)
Sodium: 135 mmol/L (ref 135–145)

## 2020-04-23 LAB — CBC
HCT: 32.3 % — ABNORMAL LOW (ref 36.0–46.0)
Hemoglobin: 10.4 g/dL — ABNORMAL LOW (ref 12.0–15.0)
MCH: 28.5 pg (ref 26.0–34.0)
MCHC: 32.2 g/dL (ref 30.0–36.0)
MCV: 88.5 fL (ref 80.0–100.0)
Platelets: 253 10*3/uL (ref 150–400)
RBC: 3.65 MIL/uL — ABNORMAL LOW (ref 3.87–5.11)
RDW: 13.6 % (ref 11.5–15.5)
WBC: 3.7 10*3/uL — ABNORMAL LOW (ref 4.0–10.5)
nRBC: 0 % (ref 0.0–0.2)

## 2020-04-23 NOTE — ED Triage Notes (Signed)
Patient arrived stating that she was tested positive for covid-19 on Friday. Reports feeling bad with complaints of shortness of breath, headache, loss of appetite and feeling fatigued. Reports taking tylenol and musinex.

## 2020-04-24 ENCOUNTER — Emergency Department (HOSPITAL_COMMUNITY)
Admission: EM | Admit: 2020-04-24 | Discharge: 2020-04-24 | Disposition: A | Discharge status: Home / Self Care | Payer: Medicaid Other | Attending: Emergency Medicine | Admitting: Emergency Medicine

## 2020-05-24 ENCOUNTER — Other Ambulatory Visit: Payer: Self-pay

## 2020-05-24 ENCOUNTER — Emergency Department (HOSPITAL_COMMUNITY): Payer: Medicaid Other

## 2020-05-24 ENCOUNTER — Emergency Department (HOSPITAL_COMMUNITY)
Admission: EM | Admit: 2020-05-24 | Discharge: 2020-05-25 | Disposition: A | Discharge status: Home / Self Care | Payer: Medicaid Other | Attending: Emergency Medicine | Admitting: Emergency Medicine

## 2020-05-24 DIAGNOSIS — F410 Panic disorder [episodic paroxysmal anxiety] without agoraphobia: Secondary | ICD-10-CM | POA: Insufficient documentation

## 2020-05-24 DIAGNOSIS — R0789 Other chest pain: Secondary | ICD-10-CM | POA: Diagnosis not present

## 2020-05-24 DIAGNOSIS — R079 Chest pain, unspecified: Secondary | ICD-10-CM | POA: Diagnosis present

## 2020-05-24 DIAGNOSIS — F419 Anxiety disorder, unspecified: Secondary | ICD-10-CM

## 2020-05-24 LAB — BASIC METABOLIC PANEL
Anion gap: 10 (ref 5–15)
BUN: 5 mg/dL — ABNORMAL LOW (ref 6–20)
CO2: 20 mmol/L — ABNORMAL LOW (ref 22–32)
Calcium: 8.5 mg/dL — ABNORMAL LOW (ref 8.9–10.3)
Chloride: 105 mmol/L (ref 98–111)
Creatinine, Ser: 0.55 mg/dL (ref 0.44–1.00)
GFR, Estimated: >60 mL/min (ref >60)
Glucose, Bld: 124 mg/dL — ABNORMAL HIGH (ref 70–99)
Potassium: 4 mmol/L (ref 3.5–5.1)
Sodium: 135 mmol/L (ref 135–145)

## 2020-05-24 LAB — CBC
HCT: 29.7 % — ABNORMAL LOW (ref 36.0–46.0)
Hemoglobin: 9.4 g/dL — ABNORMAL LOW (ref 12.0–15.0)
MCH: 28.4 pg (ref 26.0–34.0)
MCHC: 31.6 g/dL (ref 30.0–36.0)
MCV: 89.7 fL (ref 80.0–100.0)
Platelets: 311 10*3/uL (ref 150–400)
RBC: 3.31 MIL/uL — ABNORMAL LOW (ref 3.87–5.11)
RDW: 13.6 % (ref 11.5–15.5)
WBC: 8.6 10*3/uL (ref 4.0–10.5)
nRBC: 0 % (ref 0.0–0.2)

## 2020-05-24 LAB — TROPONIN I (HIGH SENSITIVITY)
Troponin I (High Sensitivity): 4 ng/L (ref <18)
Troponin I (High Sensitivity): 3 ng/L (ref <18)

## 2020-05-24 NOTE — ED Triage Notes (Addendum)
Pt presents to ED POV. Pt c/o CP and anxiety. Pt reports that she took her hydroxyzine w/o relief. No cardiac hx, pt is 26w pregnant

## 2020-05-25 NOTE — Discharge Instructions (Addendum)
You have been seen here for chest pain and anxiety.  Lab work and imaging all looks reassuring.  I recommend continuing with your medications as prescribed.  Have given you information on how to help manage your anxiety please read.  Please call your OB/GYN on Monday to inquire about further options to manage anxiety.  Come back to the emergency department if you develop chest pain, shortness of breath, severe abdominal pain, uncontrolled nausea, vomiting, diarrhea.

## 2020-05-25 NOTE — ED Provider Notes (Signed)
[facility] EMERGENCY DEPARTMENT Provider Note   CSN: [id number] Arrival date & time: 05/24/20  1631     History Chief Complaint  Patient presents with  . Chest Pain    Kathleen Swanson is a 25 y.o. female.  HPI   Patient with significant medical history of anxiety, migraines, [redacted] weeks pregnant, G3 P2-0-0-2 presents with chief complaint of chest pain and panic attack.  Patient endorses she had a panic attack yesterday and she continues to feel anxious.  She says she has taken her hydralazine as prescribed but she still feels anxious. She endorses that she has been battling anxiety for a long time and normally it goes away on its own but unfortunate this time has lasted a lot longer than usual.  She endorses that there is a lot going on in her life, states that a relative just passed away, she is pregnant, and is trying to provide for her family.  She denies suicidal or homicidal ideations, denies hallucinations or delusions.  She also endorses that she had some chest pain on the left side of her chest, does not radiate, is not associated with shortness of breath, not exacerbated by exertion.  She has no cardiac history, no history of PEs or DVTs.  Chest pain has resolved, she has no complaints at this time.  She is here because she needed help with her anxiety.  Patient denies abdominal pain, recent abdominal trauma, denies urinary symptoms, vaginal discharge, vaginal bleeding.  Patient denies fevers, chills, shortness of breath, chest pain, dumping, nausea, vomiting, diarrhea, pedal edema.  Past Medical History:  Diagnosis Date  . Anxiety   . Migraine     There are no problems to display for this patient.   Past Surgical History:  Procedure Laterality Date  . ADENOIDECTOMY    . CESAREAN SECTION    . WISDOM TOOTH EXTRACTION       OB History    Gravida  4   Para  2   Term  2   Preterm  0   AB  1   Living  2     SAB  1   IAB      Ectopic  0    Multiple  0   Live Births              No family history on file.  Social History   Tobacco Use  . Smoking status: Never Smoker  . Smokeless tobacco: Never Used  Vaping Use  . Vaping Use: Never used  Substance Use Topics  . Alcohol use: No  . Drug use: No    Home Medications Prior to Admission medications   Medication Sig Start Date End Date Taking? Authorizing Provider  amoxicillin (AMOXIL) 500 MG capsule Take 1 capsule (500 mg total) by mouth 3 (three) times daily. 03/16/20   Antony Madura, PA-C  cetirizine (ZYRTEC ALLERGY) 10 MG tablet Take 1 tablet (10 mg total) by mouth daily. 02/17/20   Roxy Horseman, PA-C  hydrOXYzine (VISTARIL) 50 MG capsule Take 50 mg by mouth 2 (two) times daily as needed for anxiety.     [provider]  ondansetron (ZOFRAN) 4 MG tablet Take 1 tablet (4 mg total) by mouth every 8 (eight) hours as needed for nausea or vomiting. 09/26/19   Devoria Albe, MD  valACYclovir (VALTREX) 500 MG tablet Take 500 mg by mouth daily.    [provider]    Allergies    Metronidazole  Review  of Systems   Review of Systems  Constitutional: Negative for chills and fever.  HENT: Negative for congestion and sore throat.   Eyes: Negative for visual disturbance.  Respiratory: Negative for shortness of breath.   Cardiovascular: Negative for chest pain.  Gastrointestinal: Negative for abdominal pain, diarrhea, nausea and vomiting.  Genitourinary: Negative for difficulty urinating, enuresis, hematuria, pelvic pain, vaginal bleeding, vaginal discharge and vaginal pain.  Musculoskeletal: Negative for back pain.  Skin: Negative for rash.  Neurological: Positive for headaches. Negative for dizziness.  Hematological: Does not bruise/bleed easily.  Psychiatric/Behavioral: Negative for self-injury and suicidal ideas. The patient is nervous/anxious.     Physical Exam Updated Vital Signs BP 119/61   Pulse 94   Temp 98.4 F (36.9 C) (Oral)   Resp 12    LMP 11/25/2019   SpO2 100%   Physical Exam Vitals and nursing note reviewed.  Constitutional:      General: She is not in acute distress.    Appearance: She is not ill-appearing.  HENT:     Head: Normocephalic and atraumatic.     Nose: No congestion.  Eyes:     Extraocular Movements: Extraocular movements intact.     Conjunctiva/sclera: Conjunctivae normal.     Pupils: Pupils are equal, round, and reactive to light.  Neck:     Comments: Neck was palpated, there is no enlarged thyroid, or palpable nodes present. Cardiovascular:     Rate and Rhythm: Normal rate and regular rhythm.     Pulses: Normal pulses.     Heart sounds: No murmur heard. No friction rub. No gallop.   Pulmonary:     Effort: No respiratory distress.     Breath sounds: No wheezing, rhonchi or rales.  Abdominal:     General: There is distension.     Palpations: Abdomen is soft.     Tenderness: There is no abdominal tenderness. There is no right CVA tenderness or left CVA tenderness.     Comments: Patient's abdomen is distended appears to be 20 to [redacted] weeks pregnant, fundus is between the umbilicus and sternum, patient is nontender to palpation, no peritoneal sign, no CVA tenderness present.  Musculoskeletal:     Cervical back: Normal range of motion.     Comments: Patient is moving all 4 extremities at difficulty.  Skin:    General: Skin is warm and dry.  Neurological:     Mental Status: She is alert.     Comments: Patient is having no difficulty with word finding.  Psychiatric:        Mood and Affect: Mood normal.     ED Results / Procedures / Treatments   Labs (all labs ordered are listed, but only abnormal results are displayed) Labs Reviewed  BASIC METABOLIC PANEL - Abnormal; Notable for the following components:      Result Value   CO2 20 (*)    Glucose, Bld 124 (*)    BUN 5 (*)    Calcium 8.5 (*)    All other components within normal limits  CBC - Abnormal; Notable for the following  components:   RBC 3.31 (*)    Hemoglobin 9.4 (*)    HCT 29.7 (*)    All other components within normal limits  I-STAT BETA HCG BLOOD, ED ([facility], [facility], [facility] ONLY)  TROPONIN I (HIGH SENSITIVITY)  TROPONIN I (HIGH SENSITIVITY)    EKG EKG Interpretation  Date/Time:  Friday May 24 2020 17:05:21 EST Ventricular Rate:  83 PR Interval:  152 QRS Duration: 88 QT Interval:  362 QTC Calculation: 425 R Axis:   71 Text Interpretation: Normal sinus rhythm with sinus arrhythmia Normal ECG No significant change since last tracing Confirmed by Gwyneth Sprout ([id number]) on 05/25/2020 2:48:24 PM   Radiology DG Chest 2 View  Result Date: 05/24/2020 CLINICAL DATA:  Pregnant, chest pain for 1 day EXAM: CHEST - 2 VIEW COMPARISON:  04/23/2020 FINDINGS: Frontal and lateral views of the chest demonstrate an unremarkable cardiac silhouette. No airspace disease, effusion, or pneumothorax. Resolution of the right upper lobe opacity seen previously. No acute bony abnormalities. IMPRESSION: 1. No acute intrathoracic process. Electronically Signed   By: Sharlet Salina M.D.   On: 05/24/2020 17:27    Procedures Procedures   Medications Ordered in ED Medications - No data to display  ED Course  I have reviewed the triage vital signs and the nursing notes.  Pertinent labs & imaging results that were available during my care of the patient were reviewed by me and considered in my medical decision making (see chart for details).    MDM Rules/Calculators/A&P                          Initial impression-patient presents with chest pain and anxiety.  She is alert, does not appear in acute distress, vital signs reassuring.  Concern for ACS, CVA, intracranial head bleed, psychiatric emergency.  triage obtained basic lab work, EKG, chest x-ray.  Work-up-CBC shows no leukocytosis, shows normocytic anemia appears to be a baseline for patient.  BMP shows slight decrease in CO2 of 20, slight increase in glucose of 124, no  anion gap or AKI present.  Patient's first troponin was 4, second troponin is 3.  Chest x-ray is negative for acute findings.  EKG shows sinus arrhythmia no signs of ischemia present.  Rule out- I have low suspicion for ACS as history is atypical, patient has no cardiac history, EKG was sinus rhythm without signs of ischemia, patient had a delta troponin.  Low suspicion for PE as patient denies pleuritic chest pain, shortness of breath, patient denies leg pain, no pedal edema noted on exam, patient is PERC negative.  Low suspicion for AAA or aortic dissection as history is atypical, patient has low risk factors.  Low suspicion for hyperthyroidism as vital signs are reassuring, patient has no history of thyroid issues, there is no palpable nodes within the thyroid, thyroid was not swollen on exam.  Low suspicion for CVA or intracranial head bleed as patient denies change in vision, paresthesias, weakness, nausea or vomiting, no neuro deficits noted on exam.  Low suspicion for psychiatric emergency as patient denies hallucinations, delusions, suicidal or homicidal ideations.  Low suspicion for OB complications as patient denies abdominal pain, vaginal bleeding or vaginal discharge, abdomen was soft nontender to palpation.  Low suspicion for systemic infection as patient is nontoxic-appearing, vital signs reassuring, no obvious source infection noted on exam.  Plan-suspect patient suffering from anxiety which gave her some chest palpitations.  Will recommend she continues with her hydralazine as prescribed and following up with your OB/GYN for further recommendations.  Vital signs have remained stable, no indication for hospital admission.  Patient discussed with attending and they agreed with assessment and plan.  Patient given at home care as well strict return precautions.  Patient verbalized that they understood agreed to said plan.   Final Clinical Impression(s) / ED Diagnoses Final diagnoses:  Atypical  chest pain  Anxiety  Rx / DC Orders ED Discharge Orders    None       Barnie Del 05/25/20 1635    Tegeler, Canary Brim, MD 05/25/20 [phone]

## 2020-05-25 NOTE — ED Notes (Signed)
Discharge instructions discussed with pt. Pt verbalized understanding. Pt stable and ambulatory. No signature pad available. 

## 2020-05-31 ENCOUNTER — Emergency Department (HOSPITAL_COMMUNITY): Payer: Medicaid Other

## 2020-05-31 ENCOUNTER — Other Ambulatory Visit: Payer: Self-pay

## 2020-05-31 ENCOUNTER — Emergency Department (HOSPITAL_COMMUNITY)
Admission: EM | Admit: 2020-05-31 | Discharge: 2020-06-01 | Disposition: A | Discharge status: Home / Self Care | Payer: Medicaid Other | Attending: Emergency Medicine | Admitting: Emergency Medicine

## 2020-05-31 DIAGNOSIS — O26893 Other specified pregnancy related conditions, third trimester: Secondary | ICD-10-CM | POA: Diagnosis present

## 2020-05-31 DIAGNOSIS — Z3A26 26 weeks gestation of pregnancy: Secondary | ICD-10-CM | POA: Insufficient documentation

## 2020-05-31 DIAGNOSIS — F419 Anxiety disorder, unspecified: Secondary | ICD-10-CM

## 2020-05-31 LAB — TROPONIN I (HIGH SENSITIVITY): Troponin I (High Sensitivity): 7 ng/L (ref <18)

## 2020-05-31 LAB — I-STAT BETA HCG BLOOD, ED (MC, WL, AP ONLY): I-stat hCG, quantitative: >2000 m[IU]/mL — ABNORMAL HIGH (ref <5)

## 2020-05-31 LAB — BASIC METABOLIC PANEL
Anion gap: 9 (ref 5–15)
BUN: 8 mg/dL (ref 6–20)
CO2: 22 mmol/L (ref 22–32)
Calcium: 8.8 mg/dL — ABNORMAL LOW (ref 8.9–10.3)
Chloride: 102 mmol/L (ref 98–111)
Creatinine, Ser: 0.69 mg/dL (ref 0.44–1.00)
GFR, Estimated: >60 mL/min (ref >60)
Glucose, Bld: 89 mg/dL (ref 70–99)
Potassium: 3.7 mmol/L (ref 3.5–5.1)
Sodium: 133 mmol/L — ABNORMAL LOW (ref 135–145)

## 2020-05-31 LAB — CBC
HCT: 29.7 % — ABNORMAL LOW (ref 36.0–46.0)
Hemoglobin: 9.4 g/dL — ABNORMAL LOW (ref 12.0–15.0)
MCH: 28.1 pg (ref 26.0–34.0)
MCHC: 31.6 g/dL (ref 30.0–36.0)
MCV: 88.7 fL (ref 80.0–100.0)
Platelets: 401 10*3/uL — ABNORMAL HIGH (ref 150–400)
RBC: 3.35 MIL/uL — ABNORMAL LOW (ref 3.87–5.11)
RDW: 13.3 % (ref 11.5–15.5)
WBC: 11.7 10*3/uL — ABNORMAL HIGH (ref 4.0–10.5)
nRBC: 0 % (ref 0.0–0.2)

## 2020-05-31 NOTE — ED Triage Notes (Addendum)
Pt presents to ED POV. Pt c/o L CP that radiates to L arm. Pt seen here recently for same. Pt reports that pain came back to around 1300-1400 at rest. No cardiac hx. Pt is 26w pregnant. No OB complaints

## 2020-06-01 LAB — TROPONIN I (HIGH SENSITIVITY): Troponin I (High Sensitivity): 11 ng/L (ref <18)

## 2020-06-01 NOTE — ED Provider Notes (Signed)
[facility] EMERGENCY DEPARTMENT Provider Note   CSN: [id number] Arrival date & time: 05/31/20  2010     History Chief Complaint  Patient presents with  . Chest Pain    Kathleen Swanson is a 25 y.o. female.  25 year old female presents with chest tightness.  History of anxiety and seen for same recently.  States that she has been more anxious and has had increased stress.  States that this feels similar to that.  Denies any anginal or CHF type symptoms.  No leg pain or swelling.  Discomfort was not pleuritic.  She is currently [redacted] weeks pregnant and denies any OB complaints.        Past Medical History:  Diagnosis Date  . Anxiety   . Migraine     There are no problems to display for this patient.   Past Surgical History:  Procedure Laterality Date  . ADENOIDECTOMY    . CESAREAN SECTION    . WISDOM TOOTH EXTRACTION       OB History    Gravida  4   Para  2   Term  2   Preterm  0   AB  1   Living  2     SAB  1   IAB      Ectopic  0   Multiple  0   Live Births              No family history on file.  Social History   Tobacco Use  . Smoking status: Never Smoker  . Smokeless tobacco: Never Used  Vaping Use  . Vaping Use: Never used  Substance Use Topics  . Alcohol use: No  . Drug use: No    Home Medications Prior to Admission medications   Medication Sig Start Date End Date Taking? Authorizing Provider  amoxicillin (AMOXIL) 500 MG capsule Take 1 capsule (500 mg total) by mouth 3 (three) times daily. 03/16/20   Antony Madura, PA-C  cetirizine (ZYRTEC ALLERGY) 10 MG tablet Take 1 tablet (10 mg total) by mouth daily. 02/17/20   Roxy Horseman, PA-C  hydrOXYzine (VISTARIL) 50 MG capsule Take 50 mg by mouth 2 (two) times daily as needed for anxiety.     [provider]  ondansetron (ZOFRAN) 4 MG tablet Take 1 tablet (4 mg total) by mouth every 8 (eight) hours as needed for nausea or vomiting. 09/26/19   Devoria Albe, MD   valACYclovir (VALTREX) 500 MG tablet Take 500 mg by mouth daily.    [provider]    Allergies    Metronidazole  Review of Systems   Review of Systems  All other systems reviewed and are negative.   Physical Exam Updated Vital Signs BP 118/72 (BP Location: Right Arm)   Pulse 76   Temp 98.1 F (36.7 C) (Oral)   Resp 17   LMP 11/25/2019 Comment: pt shielded  SpO2 100%   Physical Exam Vitals and nursing note reviewed.  Constitutional:      General: She is not in acute distress.    Appearance: Normal appearance. She is well-developed and well-nourished. She is not toxic-appearing.  HENT:     Head: Normocephalic and atraumatic.  Eyes:     General: Lids are normal.     Extraocular Movements: EOM normal.     Conjunctiva/sclera: Conjunctivae normal.     Pupils: Pupils are equal, round, and reactive to light.  Neck:     Thyroid: No thyroid mass.  Trachea: No tracheal deviation.  Cardiovascular:     Rate and Rhythm: Normal rate and regular rhythm.     Heart sounds: Normal heart sounds. No murmur heard. No gallop.   Pulmonary:     Effort: Pulmonary effort is normal. No respiratory distress.     Breath sounds: Normal breath sounds. No stridor. No decreased breath sounds, wheezing, rhonchi or rales.  Abdominal:     General: Bowel sounds are normal. There is no distension.     Palpations: Abdomen is soft.     Tenderness: There is no abdominal tenderness. There is no CVA tenderness or rebound.  Musculoskeletal:        General: No tenderness or edema. Normal range of motion.     Cervical back: Normal range of motion and neck supple.  Skin:    General: Skin is warm and dry.     Findings: No abrasion or rash.  Neurological:     Mental Status: She is alert and oriented to person, place, and time.     GCS: GCS eye subscore is 4. GCS verbal subscore is 5. GCS motor subscore is 6.     Cranial Nerves: No cranial nerve deficit.     Sensory: No sensory deficit.      Deep Tendon Reflexes: Strength normal.  Psychiatric:        Attention and Perception: Attention normal.        Mood and Affect: Mood is anxious.        Speech: Speech normal.        Behavior: Behavior normal.     ED Results / Procedures / Treatments   Labs (all labs ordered are listed, but only abnormal results are displayed) Labs Reviewed  BASIC METABOLIC PANEL - Abnormal; Notable for the following components:      Result Value   Sodium 133 (*)    Calcium 8.8 (*)    All other components within normal limits  CBC - Abnormal; Notable for the following components:   WBC 11.7 (*)    RBC 3.35 (*)    Hemoglobin 9.4 (*)    HCT 29.7 (*)    Platelets 401 (*)    All other components within normal limits  I-STAT BETA HCG BLOOD, ED ([facility], [facility], [facility] ONLY) - Abnormal; Notable for the following components:   I-stat hCG, quantitative >2,000.0 (*)    All other components within normal limits  TROPONIN I (HIGH SENSITIVITY)  TROPONIN I (HIGH SENSITIVITY)    EKG EKG Interpretation  Date/Time:  Friday May 31 2020 20:51:47 EST Ventricular Rate:  95 PR Interval:  146 QRS Duration: 86 QT Interval:  356 QTC Calculation: 447 R Axis:   74 Text Interpretation: Normal sinus rhythm Nonspecific ST abnormality Abnormal ECG No significant change since last tracing Confirmed by Lorre Nick ([id number]) on 06/01/2020 7:16:15 AM   Radiology DG Chest 2 View  Result Date: 05/31/2020 CLINICAL DATA:  Chest pain radiating to the left arm. EXAM: CHEST - 2 VIEW COMPARISON:  05/24/2020 FINDINGS: Normal heart, mediastinum and hila. Clear lungs. No pleural effusion or pneumothorax. S-shaped thoracolumbar scoliosis. Skeletal structures otherwise unremarkable. IMPRESSION: No active cardiopulmonary disease. Electronically Signed   By: Amie Portland M.D.   On: 05/31/2020 21:12    Procedures Procedures   Medications Ordered in ED Medications - No data to display  ED Course  I have reviewed the triage vital  signs and the nursing notes.  Pertinent labs & imaging results that were available during my care  of the patient were reviewed by me and considered in my medical decision making (see chart for details).    MDM Rules/Calculators/A&P                         Patient's x-ray of her chest as well as troponins and EKG within normal limits.  Suspect this was anxiety. Patient has follow-up scheduled with her OB for Monday and was encouraged to keep that. Final Clinical Impression(s) / ED Diagnoses Final diagnoses:  None    Rx / DC Orders ED Discharge Orders    None       Lorre Nick, MD 06/01/20 [phone]

## 2020-10-09 DIAGNOSIS — Z9851 Tubal ligation status: Secondary | ICD-10-CM | POA: Insufficient documentation

## 2020-11-12 ENCOUNTER — Encounter (HOSPITAL_BASED_OUTPATIENT_CLINIC_OR_DEPARTMENT_OTHER): Payer: Self-pay | Admitting: Emergency Medicine

## 2020-11-12 ENCOUNTER — Emergency Department (HOSPITAL_BASED_OUTPATIENT_CLINIC_OR_DEPARTMENT_OTHER): Payer: Medicaid Other | Admitting: Radiology

## 2020-11-12 ENCOUNTER — Emergency Department (HOSPITAL_BASED_OUTPATIENT_CLINIC_OR_DEPARTMENT_OTHER)
Admission: EM | Admit: 2020-11-12 | Discharge: 2020-11-12 | Disposition: A | Discharge status: Home / Self Care | Payer: Medicaid Other | Attending: Emergency Medicine | Admitting: Emergency Medicine

## 2020-11-12 ENCOUNTER — Other Ambulatory Visit: Payer: Self-pay

## 2020-11-12 DIAGNOSIS — R0789 Other chest pain: Secondary | ICD-10-CM | POA: Insufficient documentation

## 2020-11-12 DIAGNOSIS — R0602 Shortness of breath: Secondary | ICD-10-CM | POA: Insufficient documentation

## 2020-11-12 DIAGNOSIS — R112 Nausea with vomiting, unspecified: Secondary | ICD-10-CM | POA: Insufficient documentation

## 2020-11-12 LAB — BASIC METABOLIC PANEL
Anion gap: 9 (ref 5–15)
BUN: 12 mg/dL (ref 6–20)
CO2: 27 mmol/L (ref 22–32)
Calcium: 9.5 mg/dL (ref 8.9–10.3)
Chloride: 103 mmol/L (ref 98–111)
Creatinine, Ser: 0.9 mg/dL (ref 0.44–1.00)
GFR, Estimated: >60 mL/min (ref >60)
Glucose, Bld: 81 mg/dL (ref 70–99)
Potassium: 4.5 mmol/L (ref 3.5–5.1)
Sodium: 139 mmol/L (ref 135–145)

## 2020-11-12 LAB — TROPONIN I (HIGH SENSITIVITY): Troponin I (High Sensitivity): <2 ng/L (ref <18)

## 2020-11-12 LAB — CBC
HCT: 38.9 % (ref 36.0–46.0)
Hemoglobin: 12 g/dL (ref 12.0–15.0)
MCH: 25.2 pg — ABNORMAL LOW (ref 26.0–34.0)
MCHC: 30.8 g/dL (ref 30.0–36.0)
MCV: 81.6 fL (ref 80.0–100.0)
Platelets: 475 10*3/uL — ABNORMAL HIGH (ref 150–400)
RBC: 4.77 MIL/uL (ref 3.87–5.11)
RDW: 16.9 % — ABNORMAL HIGH (ref 11.5–15.5)
WBC: 6.3 10*3/uL (ref 4.0–10.5)
nRBC: 0 % (ref 0.0–0.2)

## 2020-11-12 NOTE — Discharge Instructions (Addendum)
You presented to the ED with chest tightness. You have a history of anxiety and GERD. You take Vistaril for anxiety and nothing for the GERD. Your workup for heart related chest pain is negative. You are currently not taking any anti-anxiety medicine to prevent you from getting anxious but rather taking a medicine that you take when you get anxious. With your frequency I would recommend talking with your PCP about starting an SSRI for your anxiety after thorough workup. I would recommend you take a PPI that you discontinued before for your heart burn. If you get chest pain, or shortness of breath please come back to the ED.

## 2020-11-12 NOTE — ED Triage Notes (Signed)
Pt reports left side chest pain since last night, along with one episode of vomiting.  +headache started yesterday also.

## 2020-11-12 NOTE — ED Provider Notes (Signed)
[facility] EMERGENCY DEPT Provider Note   CSN: [id number] Arrival date & time: 11/12/20  1820     History Chief Complaint  Patient presents with   Chest Pain    Kathleen Swanson is a 25 y.o. female with past medical history of anxiety and migraines presenting to the ED with chest tightness. She has a history of panic attacks and states gets one on a weekly basis. The symptoms started yesterday she stated she had some SOB, nausea and vomiting with this. The vistaril helped some but not as much as it usually does. Meditation helped her some along with her kids taking her mind off of her symptoms. She currently takes Vistaril as needed for anxiety. But she is not on any SSRI or SNRI and has not been on one as far as she can recall.      Past Medical History:  Diagnosis Date   Anxiety    Migraine     There are no problems to display for this patient.   Past Surgical History:  Procedure Laterality Date   ADENOIDECTOMY     CESAREAN SECTION     WISDOM TOOTH EXTRACTION       OB History     Gravida  4   Para  2   Term  2   Preterm  0   AB  1   Living  2      SAB  1   IAB      Ectopic  0   Multiple  0   Live Births              No family history on file.  Social History   Tobacco Use   Smoking status: Never   Smokeless tobacco: Never  Vaping Use   Vaping Use: Never used  Substance Use Topics   Alcohol use: No   Drug use: No    Home Medications Prior to Admission medications   Medication Sig Start Date End Date Taking? Authorizing Provider  amoxicillin (AMOXIL) 500 MG capsule Take 1 capsule (500 mg total) by mouth 3 (three) times daily. 03/16/20   Antony Madura, PA-C  cetirizine (ZYRTEC ALLERGY) 10 MG tablet Take 1 tablet (10 mg total) by mouth daily. 02/17/20   Roxy Horseman, PA-C  hydrOXYzine (VISTARIL) 50 MG capsule Take 50 mg by mouth 2 (two) times daily as needed for anxiety.     [provider]  ondansetron  (ZOFRAN) 4 MG tablet Take 1 tablet (4 mg total) by mouth every 8 (eight) hours as needed for nausea or vomiting. 09/26/19   Devoria Albe, MD  valACYclovir (VALTREX) 500 MG tablet Take 500 mg by mouth daily.    [provider]    Allergies    Metronidazole  Review of Systems   Review of Systems  Constitutional:  Negative for chills and fever.  HENT:  Negative for ear discharge, ear pain and sore throat.   Eyes:  Negative for pain and visual disturbance.  Respiratory:  Positive for chest tightness and shortness of breath. Negative for cough.   Cardiovascular:  Negative for chest pain and palpitations.  Gastrointestinal:  Positive for nausea and vomiting. Negative for abdominal pain.  Genitourinary:  Negative for dysuria and hematuria.  Musculoskeletal:  Negative for arthralgias and back pain.  Skin:  Negative for color change and rash.  Neurological:  Negative for seizures and syncope.  Psychiatric/Behavioral:  Negative for agitation and behavioral problems.   All other systems reviewed and are  negative.  Physical Exam Updated Vital Signs BP 104/72 (BP Location: Right Arm)   Pulse 62   Temp 98.2 F (36.8 C) (Oral)   Resp 18   Ht 5\' 4"  (1.626 m)   Wt 63.5 kg   LMP 11/06/2020 (Approximate)   SpO2 100%   Breastfeeding No   BMI 24.03 kg/m   Physical Exam Vitals and nursing note reviewed.  Constitutional:      General: She is not in acute distress.    Appearance: She is well-developed.  HENT:     Head: Normocephalic and atraumatic.  Eyes:     Extraocular Movements: Extraocular movements intact.     Conjunctiva/sclera: Conjunctivae normal.     Pupils: Pupils are equal, round, and reactive to light.  Cardiovascular:     Rate and Rhythm: Normal rate and regular rhythm.     Heart sounds: Normal heart sounds. No murmur heard. Pulmonary:     Effort: Pulmonary effort is normal. No respiratory distress.     Breath sounds: Normal breath sounds.  Abdominal:     General:  Bowel sounds are normal.     Palpations: Abdomen is soft.     Tenderness: There is no abdominal tenderness.  Musculoskeletal:        General: Normal range of motion.     Cervical back: Normal range of motion and neck supple.  Skin:    General: Skin is warm and dry.  Neurological:     Mental Status: She is alert.    ED Results / Procedures / Treatments   Labs (all labs ordered are listed, but only abnormal results are displayed) Labs Reviewed  CBC - Abnormal; Notable for the following components:      Result Value   MCH 25.2 (*)    RDW 16.9 (*)    Platelets 475 (*)    All other components within normal limits  BASIC METABOLIC PANEL  TROPONIN I (HIGH SENSITIVITY)    EKG EKG Interpretation  Date/Time:  Tuesday November 12 2020 18:28:44 EDT Ventricular Rate:  70 PR Interval:  160 QRS Duration: 90 QT Interval:  388 QTC Calculation: 419 R Axis:   53 Text Interpretation: Normal sinus rhythm with sinus arrhythmia Confirmed by [id number] ([id number]) on 11/12/2020 6:37:30 PM  Radiology DG Chest 2 View  Result Date: 11/12/2020 CLINICAL DATA:  Chest pain EXAM: CHEST - 2 VIEW COMPARISON:  Radiograph 05/31/2020 FINDINGS: No consolidation, features of edema, pneumothorax, or effusion. Pulmonary vascularity is normally distributed. The cardiomediastinal contours are unremarkable. No acute osseous or soft tissue abnormality. IMPRESSION: No acute cardiopulmonary abnormality. Electronically Signed   By: 07/29/2020 M.D.   On: 11/12/2020 20:27    Procedures Procedures   Medications Ordered in ED Medications - No data to display  ED Course  I have reviewed the triage vital signs and the nursing notes.  Pertinent labs & imaging results that were available during my care of the patient were reviewed by me and considered in my medical decision making (see chart for details).    MDM Rules/Calculators/A&P                           Chest Tightness/ShOB Patient presented to the ED with  chest tightness with one day duration along with some shortness of breath and nausea and vomiting. Ddx include ACS vs Panic Attack vs GERD vs Pericarditis vs PE vs PNA. EKG and troponin was negative ruling out ACS. Most likely given  her history of anxiety and ED visits, she had another anxiety attack. GERD could be playing a role as patient has not been taking any medication for her GERD and was told she has reflux. Pericarditis and PNA are ruled out by EKG and CXR respectively. PE is ruled out by [facility] negative and current saturation of 100 % on room air.   Plan: -CBC. BMP, EKG, Troponin, CXR wnl -Since patient feels at baseline, advise PCP follow up  -Recommended [facility] as she is new to area  -Needs outpatient anxiety regimen (SSRI with anxiolytic like benzo for breakthrough attacks) as she had multiple ED visits due to this reason.  -Take OTC PPI -Go to [facility] if needed  Final Clinical Impression(s) / ED Diagnoses Final diagnoses:  None    Rx / DC Orders ED Discharge Orders     None        Gwenevere Abbot, MD 11/12/20 2117    Virgina Norfolk, DO 11/12/20 2250

## 2020-11-12 NOTE — ED Provider Notes (Signed)
I have personally seen and examined the patient. I have reviewed the documentation on PMH/FH/Soc Hx. I have discussed the plan of care with the resident and patient.  I have reviewed and agree with the resident's documentation. Please see associated encounter note.  Briefly, the patient is a 25 y.o. female here with chest pain.  No cardiac risk factors.  Heart score 0.  EKG shows sinus rhythm.  No ischemic changes.  Troponin normal.  PERC negative.  Doubt PE.  No pneumonia, pneumothorax, no significant anemia or electrolyte abnormality.  Overall suspect stress and anxiety as cause of her intermittent chest pain.  We will have her follow-up with her primary care doctor.  Discharged in good condition.  This chart was dictated using voice recognition software.  Despite best efforts to proofread,  errors can occur which can change the documentation meaning.    EKG Interpretation  Date/Time:  Tuesday November 12 2020 18:28:44 EDT Ventricular Rate:  70 PR Interval:  160 QRS Duration: 90 QT Interval:  388 QTC Calculation: 419 R Axis:   53 Text Interpretation: Normal sinus rhythm with sinus arrhythmia Confirmed by Virgina Norfolk ([id number]) on 11/12/2020 6:37:30 PM          Virgina Norfolk, DO 11/12/20 2049

## 2021-04-03 ENCOUNTER — Emergency Department (HOSPITAL_BASED_OUTPATIENT_CLINIC_OR_DEPARTMENT_OTHER)
Admission: EM | Admit: 2021-04-03 | Discharge: 2021-04-04 | Disposition: A | Discharge status: Home / Self Care | Payer: Medicaid Other | Attending: Emergency Medicine | Admitting: Emergency Medicine

## 2021-04-03 ENCOUNTER — Other Ambulatory Visit: Payer: Self-pay

## 2021-04-03 ENCOUNTER — Encounter (HOSPITAL_BASED_OUTPATIENT_CLINIC_OR_DEPARTMENT_OTHER): Payer: Self-pay | Admitting: *Deleted

## 2021-04-03 DIAGNOSIS — F419 Anxiety disorder, unspecified: Secondary | ICD-10-CM | POA: Diagnosis not present

## 2021-04-03 DIAGNOSIS — R519 Headache, unspecified: Secondary | ICD-10-CM | POA: Diagnosis not present

## 2021-04-03 NOTE — ED Triage Notes (Signed)
Pt is ambulatory to treatment room. States her anxiety is "acting up" and she took her hydroxyzine about 2 hours ago. Around the same time she started having a migraine, but is unable to take her migraine medications because it interacts with her anxiety medication. No SI or HI.

## 2021-04-04 MED ORDER — LACTATED RINGERS IV BOLUS
1000.0000 mL | Freq: Once | INTRAVENOUS | Status: AC
  Administered 2021-04-04: 1000 mL via INTRAVENOUS

## 2021-04-04 MED ORDER — DIPHENHYDRAMINE HCL 50 MG/ML IJ SOLN
25.0000 mg | Freq: Once | INTRAMUSCULAR | Status: DC
  Filled 2021-04-04: qty 1

## 2021-04-04 MED ORDER — PROCHLORPERAZINE EDISYLATE 10 MG/2ML IJ SOLN
10.0000 mg | Freq: Once | INTRAMUSCULAR | Status: AC
  Administered 2021-04-04: 10 mg via INTRAVENOUS
  Filled 2021-04-04: qty 2

## 2021-04-04 NOTE — Discharge Instructions (Addendum)
Talk with your provider about getting medication that can help with your anxiety without better interactions with your other medications.

## 2021-04-04 NOTE — ED Provider Notes (Signed)
[facility] EMERGENCY DEPT Provider Note   CSN: [id number] Arrival date & time: 04/03/21  2303     History Chief Complaint  Patient presents with   Migraine    Kathleen Swanson is a 25 y.o. female.  The history is provided by the patient.  Migraine She has history of anxiety and migraines and comes in because of exacerbation of both.  This afternoon, she started feeling increased anxiety and noted a left occipital headache typical of her migraines.  Headache is sharp and throbbing and she rates it at 7/10.  There is associated photophobia but no phonophobia.  She denies visual change and denies nausea or vomiting.  She takes hydroxyzine for her anxiety, but it did not seem to give her any relief.  She does have medication that specifically for anxiety, but she cannot take it because of an interaction with another medication that she is on.  She denies fever, chills, sweats.  She denies weakness, numbness, tingling.   Past Medical History:  Diagnosis Date   Anxiety    Migraine     There are no problems to display for this patient.   Past Surgical History:  Procedure Laterality Date   ADENOIDECTOMY     CESAREAN SECTION     WISDOM TOOTH EXTRACTION       OB History     Gravida  4   Para  2   Term  2   Preterm  0   AB  1   Living  2      SAB  1   IAB      Ectopic  0   Multiple  0   Live Births              No family history on file.  Social History   Tobacco Use   Smoking status: Never   Smokeless tobacco: Never  Vaping Use   Vaping Use: Never used  Substance Use Topics   Alcohol use: No   Drug use: No    Home Medications Prior to Admission medications   Medication Sig Start Date End Date Taking? Authorizing Provider  amoxicillin (AMOXIL) 500 MG capsule Take 1 capsule (500 mg total) by mouth 3 (three) times daily. 03/16/20   Antony Madura, PA-C  cetirizine (ZYRTEC ALLERGY) 10 MG tablet Take 1 tablet (10 mg total) by mouth  daily. 02/17/20   Roxy Horseman, PA-C  hydrOXYzine (VISTARIL) 50 MG capsule Take 50 mg by mouth 2 (two) times daily as needed for anxiety.     [provider]  ondansetron (ZOFRAN) 4 MG tablet Take 1 tablet (4 mg total) by mouth every 8 (eight) hours as needed for nausea or vomiting. 09/26/19   Devoria Albe, MD  valACYclovir (VALTREX) 500 MG tablet Take 500 mg by mouth daily.    [provider]    Allergies    Metronidazole  Review of Systems   Review of Systems  All other systems reviewed and are negative.  Physical Exam Updated Vital Signs BP 109/60 (BP Location: Right Arm)    Pulse 74    Temp 98 F (36.7 C) (Oral)    Resp 16    Ht 5\' 4"  (1.626 m)    Wt 72.6 kg    LMP 03/26/2021    SpO2 98%    BMI 27.46 kg/m   Physical Exam Vitals and nursing note reviewed.  25 year old female, resting comfortably and in no acute distress. Vital signs are normal. Oxygen  saturation is 98%, which is normal. Head is normocephalic and atraumatic. PERRLA, EOMI. Oropharynx is clear.  There is no tenderness to palpation over the temporalis muscles or over the insertion of the paracervical muscles. Neck is nontender and supple without adenopathy or JVD. Back is nontender and there is no CVA tenderness. Lungs are clear without rales, wheezes, or rhonchi. Chest is nontender. Heart has regular rate and rhythm without murmur. Abdomen is soft, flat, nontender without masses or hepatosplenomegaly and peristalsis is normoactive. Extremities have no cyanosis or edema, full range of motion is present. Skin is warm and dry without rash. Neurologic: Mental status is normal, cranial nerves are intact, strength is 5/5 in all 4 extremities.  ED Results / Procedures / Treatments    Procedures Procedures   Medications Ordered in ED Medications  diphenhydrAMINE (BENADRYL) injection 25 mg (25 mg Intravenous Not Given 04/04/21 0021)  lactated ringers bolus 1,000 mL (0 mLs Intravenous Stopped 04/04/21  0120)  prochlorperazine (COMPAZINE) injection 10 mg (10 mg Intravenous Given 04/04/21 0014)    ED Course  I have reviewed the triage vital signs and the nursing notes.  Pertinent labs & imaging results that were available during my care of the patient were reviewed by me and considered in my medical decision making (see chart for details).    MDM Rules/Calculators/A&P                         Headache, which is probably a migraine variant.  No red flags to suggest more serious causes of headache such as meningitis, subarachnoid hemorrhage, pseudotumor cerebri.  Old records are reviewed, and she does have prior ED visits for headaches.  She will be given headache cocktail of lactated Ringer solution, prochlorperazine, diphenhydramine and reassessed.  She feels much better following above-noted medication.  Both headache and anxiety are resolved.  She is discharged with instructions to follow-up with her provider to get medication options to treat her anxiety and headache that do not cause interactions with her other medications.  Final Clinical Impression(s) / ED Diagnoses Final diagnoses:  Bad headache  Anxiety    Rx / DC Orders ED Discharge Orders     None        Dione Booze, MD 04/04/21 0127

## 2021-05-29 ENCOUNTER — Emergency Department (HOSPITAL_BASED_OUTPATIENT_CLINIC_OR_DEPARTMENT_OTHER): Payer: Medicaid Other | Admitting: Radiology

## 2021-05-29 ENCOUNTER — Encounter (HOSPITAL_BASED_OUTPATIENT_CLINIC_OR_DEPARTMENT_OTHER): Payer: Self-pay

## 2021-05-29 ENCOUNTER — Emergency Department (HOSPITAL_BASED_OUTPATIENT_CLINIC_OR_DEPARTMENT_OTHER)
Admission: EM | Admit: 2021-05-29 | Discharge: 2021-05-30 | Disposition: A | Discharge status: Home / Self Care | Payer: Medicaid Other | Attending: Emergency Medicine | Admitting: Emergency Medicine

## 2021-05-29 ENCOUNTER — Other Ambulatory Visit: Payer: Self-pay

## 2021-05-29 DIAGNOSIS — R000 Tachycardia, unspecified: Secondary | ICD-10-CM | POA: Insufficient documentation

## 2021-05-29 DIAGNOSIS — Z79899 Other long term (current) drug therapy: Secondary | ICD-10-CM | POA: Insufficient documentation

## 2021-05-29 DIAGNOSIS — T50905A Adverse effect of unspecified drugs, medicaments and biological substances, initial encounter: Secondary | ICD-10-CM

## 2021-05-29 DIAGNOSIS — Z20822 Contact with and (suspected) exposure to covid-19: Secondary | ICD-10-CM | POA: Insufficient documentation

## 2021-05-29 DIAGNOSIS — R0981 Nasal congestion: Secondary | ICD-10-CM | POA: Insufficient documentation

## 2021-05-29 LAB — RESP PANEL BY RT-PCR (FLU A&B, COVID) ARPGX2
Influenza A by PCR: NEGATIVE
Influenza B by PCR: NEGATIVE
SARS Coronavirus 2 by RT PCR: NEGATIVE

## 2021-05-29 LAB — BASIC METABOLIC PANEL
Anion gap: 10 (ref 5–15)
BUN: 12 mg/dL (ref 6–20)
CO2: 24 mmol/L (ref 22–32)
Calcium: 9.4 mg/dL (ref 8.9–10.3)
Chloride: 105 mmol/L (ref 98–111)
Creatinine, Ser: 0.78 mg/dL (ref 0.44–1.00)
GFR, Estimated: >60 mL/min (ref >60)
Glucose, Bld: 94 mg/dL (ref 70–99)
Potassium: 3.5 mmol/L (ref 3.5–5.1)
Sodium: 139 mmol/L (ref 135–145)

## 2021-05-29 LAB — CBC
HCT: 37 % (ref 36.0–46.0)
Hemoglobin: 11.9 g/dL — ABNORMAL LOW (ref 12.0–15.0)
MCH: 26.7 pg (ref 26.0–34.0)
MCHC: 32.2 g/dL (ref 30.0–36.0)
MCV: 83.1 fL (ref 80.0–100.0)
Platelets: 419 10*3/uL — ABNORMAL HIGH (ref 150–400)
RBC: 4.45 MIL/uL (ref 3.87–5.11)
RDW: 14.4 % (ref 11.5–15.5)
WBC: 7 10*3/uL (ref 4.0–10.5)
nRBC: 0 % (ref 0.0–0.2)

## 2021-05-29 LAB — PREGNANCY, URINE: Preg Test, Ur: NEGATIVE

## 2021-05-29 LAB — TROPONIN I (HIGH SENSITIVITY): Troponin I (High Sensitivity): <2 ng/L (ref <18)

## 2021-05-29 NOTE — ED Triage Notes (Addendum)
Pt arrives POV stating while getting ready for bed earlier tonight her heart rate went up to 135, she checked it on her Apple watch.  She felt lightheaded but did not pass out.  She reports mild shortness of breath but denies chest pain.  States she has nasal congestion, cough for approximately one week.  She has been taking Dayquil and Alka seltzer liqigels.

## 2021-05-29 NOTE — ED Provider Notes (Signed)
[facility] EMERGENCY DEPT Provider Note   CSN: [id number] Arrival date & time: 05/29/21  2105     History  Chief Complaint  Patient presents with   Tachycardia    Kathleen Swanson is a 26 y.o. female.  The history is provided by the patient.  Illness Location:  Body at home Quality:  States she had tachycardia on apple watch after taking two cold preparations with phenylephrine in them Severity:  Mild Onset quality:  Gradual Duration:  1 hour Timing:  Constant Progression:  Resolved Chronicity:  New Context:  Took 2 cold medications with phenylephrine and drank caffeine Relieved by:  Nothing Worsened by:  Nothing Ineffective treatments:  None Associated symptoms: congestion   Associated symptoms: no abdominal pain, no chest pain, no cough, no diarrhea, no ear pain, no fatigue, no fever, no headaches, no loss of consciousness, no myalgias, no nausea, no rash, no shortness of breath, no sore throat, no vomiting and no wheezing   Patient with anxiety who took cold medication and caffeine and apple watch registered increased HR and patient felt panicked and came in.  No f/c/r.  No travel, no OCP, no leg pain.  Symptoms of congestion for > 1 week.     Home Medications Prior to Admission medications   Medication Sig Start Date End Date Taking? Authorizing Provider  cetirizine (ZYRTEC ALLERGY) 10 MG tablet Take 1 tablet (10 mg total) by mouth daily. 02/17/20   Roxy Horseman, PA-C  hydrOXYzine (VISTARIL) 50 MG capsule Take 50 mg by mouth 2 (two) times daily as needed for anxiety.     [provider]  ondansetron (ZOFRAN) 4 MG tablet Take 1 tablet (4 mg total) by mouth every 8 (eight) hours as needed for nausea or vomiting. 09/26/19   Devoria Albe, MD  valACYclovir (VALTREX) 500 MG tablet Take 500 mg by mouth daily.    [provider]      Allergies    Metronidazole    Review of Systems   Review of Systems  Constitutional:  Negative for fatigue and  fever.  HENT:  Positive for congestion. Negative for ear pain and sore throat.   Eyes:  Negative for redness.  Respiratory:  Negative for cough, shortness of breath and wheezing.   Cardiovascular:  Negative for chest pain and leg swelling.  Gastrointestinal:  Negative for abdominal pain, diarrhea, nausea and vomiting.  Musculoskeletal:  Negative for myalgias.  Skin:  Negative for rash.  Neurological:  Negative for loss of consciousness and headaches.  Psychiatric/Behavioral:  The patient is nervous/anxious.   All other systems reviewed and are negative.  Physical Exam Updated Vital Signs BP 123/81 (BP Location: Right Arm)    Pulse 86    Temp (!) 97.4 F (36.3 C)    Resp 16    Ht 5\' 4"  (1.626 m)    Wt 75.8 kg    LMP 05/22/2021 (Exact Date) Comment: states she had tubal ligation on 08/22/20   SpO2 99%    BMI 28.67 kg/m  Physical Exam Vitals and nursing note reviewed.  Constitutional:      General: She is not in acute distress.    Appearance: Normal appearance.  HENT:     Head: Normocephalic and atraumatic.     Nose: Congestion present.  Eyes:     Conjunctiva/sclera: Conjunctivae normal.     Pupils: Pupils are equal, round, and reactive to light.  Cardiovascular:     Rate and Rhythm: Normal rate and regular rhythm.  Pulses: Normal pulses.     Heart sounds: Normal heart sounds.  Pulmonary:     Effort: Pulmonary effort is normal.     Breath sounds: Normal breath sounds.  Abdominal:     General: Abdomen is flat. Bowel sounds are normal.     Palpations: Abdomen is soft.     Tenderness: There is no abdominal tenderness. There is no guarding.  Musculoskeletal:        General: Normal range of motion.     Cervical back: Normal range of motion and neck supple.  Skin:    General: Skin is warm and dry.     Capillary Refill: Capillary refill takes less than 2 seconds.  Neurological:     General: No focal deficit present.     Mental Status: She is alert and oriented to person, place,  and time.     Deep Tendon Reflexes: Reflexes normal.  Psychiatric:        Mood and Affect: Mood normal.        Behavior: Behavior normal.    ED Results / Procedures / Treatments   Labs (all labs ordered are listed, but only abnormal results are displayed) Labs Reviewed  CBC - Abnormal; Notable for the following components:      Result Value   Hemoglobin 11.9 (*)    Platelets 419 (*)    All other components within normal limits  RESP PANEL BY RT-PCR (FLU A&B, COVID) ARPGX2  BASIC METABOLIC PANEL  PREGNANCY, URINE  TROPONIN I (HIGH SENSITIVITY)  TROPONIN I (HIGH SENSITIVITY)    EKG EKG Interpretation  Date/Time:  Thursday May 29 2021 21:14:58 EST Ventricular Rate:  81 PR Interval:  164 QRS Duration: 94 QT Interval:  376 QTC Calculation: 436 R Axis:   56 Text Interpretation: Sinus rhythm with marked sinus arrhythmia Confirmed by Marguriete Wootan ([id number]) on 05/29/2021 11:30:37 PM  Radiology DG Chest 2 View  Result Date: 05/29/2021 CLINICAL DATA:  Tachycardia EXAM: CHEST - 2 VIEW COMPARISON:  11/12/2020 FINDINGS: Lungs are clear.  No pleural effusion or pneumothorax. The heart is normal in size. Visualized osseous structures are within normal limits. IMPRESSION: Normal chest radiographs. Electronically Signed   By: Charline Bills M.D.   On: 05/29/2021 21:55    Procedures Procedures    Medications Ordered in ED Medications - No data to display  ED Course/ Medical Decision Making/ A&P                           Medical Decision Making Congestion for > 1 week. Took 2 cold meds with phenylephrine and then caffeine increased HR and anxiety.    Amount and/or Complexity of Data Reviewed Labs: ordered.    Details: negative troponin, normal creatinine Radiology: ordered.    Details: normal CXR ECG/medicine tests: ordered and independent interpretation performed. Decision-making details documented in ED Course.  Risk Risk Details: Well appearing, normal exam and vital  signs.  Symptoms are consistent with adverse drug reaction with phenylephrine and caffeine and associated.  PERC negative wells 0, highly doubt PE in this low risk patient.  Negative EKG and cardiac enzymes.  This is not consistent with ACS.      Final Clinical Impression(s) / ED Diagnoses Final diagnoses:  None   Return for intractable cough, coughing up blood, fevers > 100.4 unrelieved by medication, shortness of breath, intractable vomiting, chest pain, shortness of breath, weakness, numbness, changes in speech, facial asymmetry, abdominal pain, passing out,  Inability to tolerate liquids or food, cough, altered mental status or any concerns. No signs of systemic illness or infection. The patient is nontoxic-appearing on exam and vital signs are within normal limits.  I have reviewed the triage vital signs and the nursing notes. Pertinent labs & imaging results that were available during my care of the patient were reviewed by me and considered in my medical decision making (see chart for details). After history, exam, and medical workup I feel the patient has been appropriately medically screened and is safe for discharge home. Pertinent diagnoses were discussed with the patient. Patient was given return precautions.     Rx / DC Orders ED Discharge Orders     None         Kentarius Partington, MD 05/29/21 1941

## 2021-05-30 NOTE — ED Notes (Signed)
Pt verbalizes understanding of discharge instructions. Opportunity for questioning and answers were provided. Pt discharged from ED to home.   ? ?

## 2021-06-13 DIAGNOSIS — Z8669 Personal history of other diseases of the nervous system and sense organs: Secondary | ICD-10-CM | POA: Insufficient documentation

## 2021-06-14 DIAGNOSIS — R001 Bradycardia, unspecified: Secondary | ICD-10-CM | POA: Insufficient documentation

## 2021-08-30 ENCOUNTER — Emergency Department (HOSPITAL_BASED_OUTPATIENT_CLINIC_OR_DEPARTMENT_OTHER)
Admission: EM | Admit: 2021-08-30 | Discharge: 2021-08-30 | Disposition: A | Discharge status: Home / Self Care | Payer: Medicaid Other | Attending: Emergency Medicine | Admitting: Emergency Medicine

## 2021-08-30 ENCOUNTER — Encounter (HOSPITAL_BASED_OUTPATIENT_CLINIC_OR_DEPARTMENT_OTHER): Payer: Self-pay

## 2021-08-30 DIAGNOSIS — R519 Headache, unspecified: Secondary | ICD-10-CM | POA: Diagnosis present

## 2021-08-30 DIAGNOSIS — G43909 Migraine, unspecified, not intractable, without status migrainosus: Secondary | ICD-10-CM | POA: Insufficient documentation

## 2021-08-30 MED ORDER — DIPHENHYDRAMINE HCL 50 MG/ML IJ SOLN
25.0000 mg | Freq: Once | INTRAMUSCULAR | Status: AC
  Administered 2021-08-30: 25 mg via INTRAVENOUS
  Filled 2021-08-30: qty 1

## 2021-08-30 MED ORDER — KETOROLAC TROMETHAMINE 30 MG/ML IJ SOLN
30.0000 mg | Freq: Once | INTRAMUSCULAR | Status: AC
  Administered 2021-08-30: 30 mg via INTRAVENOUS
  Filled 2021-08-30: qty 1

## 2021-08-30 MED ORDER — METOCLOPRAMIDE HCL 5 MG/ML IJ SOLN
10.0000 mg | Freq: Once | INTRAMUSCULAR | Status: AC
  Administered 2021-08-30: 10 mg via INTRAVENOUS
  Filled 2021-08-30: qty 2

## 2021-08-30 MED ORDER — DEXAMETHASONE SODIUM PHOSPHATE 10 MG/ML IJ SOLN
10.0000 mg | Freq: Once | INTRAMUSCULAR | Status: AC
  Administered 2021-08-30: 10 mg via INTRAVENOUS
  Filled 2021-08-30: qty 1

## 2021-08-30 MED ORDER — SODIUM CHLORIDE 0.9 % IV BOLUS
1000.0000 mL | Freq: Once | INTRAVENOUS | Status: AC
  Administered 2021-08-30: 1000 mL via INTRAVENOUS

## 2021-08-30 NOTE — ED Provider Notes (Signed)
?[facility] EM[facility]PT ?Provider Note ? ? ?CSN: [id number] ?Arrival date & time: 08/30/21  0208 ? ?  ? ?History ? ?Chief Complaint  ?Patient presents with  ? Migraine  ? ? ?Kathleen Swanson is a 26 y.o. female. ? ?Patient is a 26 year old female with history of migraines.  Patient presenting today with complaints of headache.  She describes a throbbing pain to the top of her head with associated weakness, numbness, or visual disturbances.  This has been ongoing for the past several days and unrelieved with Fioricet.  She denies any injury or trauma.  She denies any fevers or stiff neck.  This feels similar to migraines, however her home medications are not working. ? ?The history is provided by the patient.  ? ?  ? ?Home Medications ?Prior to Admission medications   ?Medication Sig Start Date End Date Taking? Authorizing Provider  ?cetirizine (ZYRTEC ALLERGY) 10 MG tablet Take 1 tablet (10 mg total) by mouth Kathleen. 02/17/20   Roxy Horseman, PA-C  ?hydrOXYzine (VISTARIL) 50 MG capsule Take 50 mg by mouth 2 (two) times Kathleen as needed for anxiety.     [provider]  ?ondansetron (ZOFRAN) 4 MG tablet Take 1 tablet (4 mg total) by mouth every 8 (eight) hours as needed for nausea or vomiting. 09/26/19   Devoria Albe, MD  ?valACYclovir (VALTREX) 500 MG tablet Take 500 mg by mouth Kathleen.    [provider]  ?   ? ?Allergies    ?Metronidazole   ? ?Review of Systems   ?Review of Systems  ?All other systems reviewed and are negative. ? ?Physical Exam ?Updated Vital Signs ?BP 121/62   Pulse (!) 102   Temp 98.2 ?F (36.8 ?C) (Oral)   Resp 20   SpO2 99%  ?Physical Exam ?Vitals and nursing note reviewed.  ?Constitutional:   ?   General: She is not in acute distress. ?   Appearance: She is well-developed. She is not diaphoretic.  ?HENT:  ?   Head: Normocephalic and atraumatic.  ?Eyes:  ?   Extraocular Movements: Extraocular movements intact.  ?   Pupils: Pupils are equal, round, and reactive to  light.  ?Cardiovascular:  ?   Rate and Rhythm: Normal rate and regular rhythm.  ?   Heart sounds: No murmur heard. ?  No friction rub. No gallop.  ?Pulmonary:  ?   Effort: Pulmonary effort is normal. No respiratory distress.  ?   Breath sounds: Normal breath sounds. No wheezing.  ?Abdominal:  ?   General: Bowel sounds are normal. There is no distension.  ?   Palpations: Abdomen is soft.  ?   Tenderness: There is no abdominal tenderness.  ?Musculoskeletal:     ?   General: Normal range of motion.  ?   Cervical back: Normal range of motion and neck supple.  ?Skin: ?   General: Skin is warm and dry.  ?Neurological:  ?   General: No focal deficit present.  ?   Mental Status: She is alert and oriented to person, place, and time.  ?   Cranial Nerves: No cranial nerve deficit.  ?   Sensory: No sensory deficit.  ?   Motor: No weakness.  ?   Coordination: Coordination normal.  ? ? ?ED Results / Procedures / Treatments   ?Labs ?(all labs ordered are listed, but only abnormal results are displayed) ?Labs Reviewed - No data to display ? ?EKG ?None ? ?Radiology ?No results found. ? ?Procedures ?Procedures  ? ? ?  Medications Ordered in ED ?Medications  ?sodium chloride 0.9 % bolus 1,000 mL (1,000 mLs Intravenous New Bag/Given 08/30/21 0540)  ?ketorolac (TORADOL) 30 MG/ML injection 30 mg (30 mg Intravenous Given 08/30/21 0543)  ?dexamethasone (DECADRON) injection 10 mg (10 mg Intravenous Given 08/30/21 0542)  ?metoCLOPramide (REGLAN) injection 10 mg (10 mg Intravenous Given 08/30/21 0544)  ?diphenhydrAMINE (BENADRYL) injection 25 mg (25 mg Intravenous Given 08/30/21 0541)  ? ? ?ED Course/ Medical Decision Making/ A&P ? ?Patient presenting with complaints of migraine headache.  Patient had complete relief with a migraine cocktail and IV fluids.  She seems appropriate for discharge.  There are no concerning symptoms or physical examination findings and symptoms are not similar to prior migraines.  I do not feel as though further work-up  is indicated. ? ?Final Clinical Impression(s) / ED Diagnoses ?Final diagnoses:  ?None  ? ? ?Rx / DC Orders ?ED Discharge Orders   ? ? None  ? ?  ? ? ?  ?Geoffery Lyons, MD ?08/30/21 [phone] ? ?

## 2021-08-30 NOTE — Discharge Instructions (Signed)
Continue home medications as previously prescribed. ? ?Return to the emergency department if symptoms significantly worsen or change. ?

## 2021-08-30 NOTE — ED Notes (Signed)
Unable to obtain VS at this time due to pt's small children on pt sleeping. ?

## 2021-08-30 NOTE — ED Triage Notes (Signed)
Pt states that she has been having a headache for the past 2 days with nausea that is unrelieved by OTC and Fioricet    ?

## 2021-12-28 ENCOUNTER — Encounter (HOSPITAL_BASED_OUTPATIENT_CLINIC_OR_DEPARTMENT_OTHER): Payer: Self-pay

## 2021-12-28 ENCOUNTER — Other Ambulatory Visit: Payer: Self-pay

## 2021-12-28 ENCOUNTER — Emergency Department (HOSPITAL_BASED_OUTPATIENT_CLINIC_OR_DEPARTMENT_OTHER)
Admission: EM | Admit: 2021-12-28 | Discharge: 2021-12-29 | Disposition: A | Discharge status: Home / Self Care | Payer: Medicaid Other | Attending: Emergency Medicine | Admitting: Emergency Medicine

## 2021-12-28 DIAGNOSIS — G43809 Other migraine, not intractable, without status migrainosus: Secondary | ICD-10-CM | POA: Diagnosis not present

## 2021-12-28 DIAGNOSIS — R519 Headache, unspecified: Secondary | ICD-10-CM | POA: Diagnosis present

## 2021-12-28 DIAGNOSIS — U071 COVID-19: Secondary | ICD-10-CM | POA: Diagnosis not present

## 2021-12-28 LAB — RESP PANEL BY RT-PCR (FLU A&B, COVID) ARPGX2
Influenza A by PCR: NEGATIVE
Influenza B by PCR: NEGATIVE
SARS Coronavirus 2 by RT PCR: POSITIVE — AB

## 2021-12-28 LAB — URINALYSIS, ROUTINE W REFLEX MICROSCOPIC
Bilirubin Urine: NEGATIVE
Glucose, UA: NEGATIVE mg/dL
Hgb urine dipstick: NEGATIVE
Ketones, ur: NEGATIVE mg/dL
Leukocytes,Ua: NEGATIVE
Nitrite: NEGATIVE
Protein, ur: 30 mg/dL — AB
Specific Gravity, Urine: 1.03 (ref 1.005–1.030)
pH: 7.5 (ref 5.0–8.0)

## 2021-12-28 LAB — PREGNANCY, URINE: Preg Test, Ur: NEGATIVE

## 2021-12-28 MED ORDER — PROMETHAZINE HCL 25 MG/ML IJ SOLN
25.0000 mg | Freq: Four times a day (QID) | INTRAMUSCULAR | Status: DC | PRN
Start: 2021-12-28 — End: 2021-12-29
  Administered 2021-12-29: 25 mg via INTRAMUSCULAR
  Filled 2021-12-28: qty 1

## 2021-12-28 MED ORDER — KETOROLAC TROMETHAMINE 30 MG/ML IJ SOLN
30.0000 mg | Freq: Once | INTRAMUSCULAR | Status: AC
  Administered 2021-12-29: 30 mg via INTRAMUSCULAR
  Filled 2021-12-28: qty 1

## 2021-12-28 NOTE — ED Triage Notes (Signed)
Patient here POV from Home.  Headache for approximately 24-36 Hours.   History of Infrequent Migraines. Chills, Cough and Some Photosensitivity.   No Known Fevers.   NAD Noted during Triage. A&Ox4. GCS 15. Ambulatory.

## 2021-12-29 MED ORDER — ACETAMINOPHEN 500 MG PO TABS
1000.0000 mg | ORAL_TABLET | Freq: Once | ORAL | Status: AC
Start: 2021-12-29 — End: 2021-12-29
  Administered 2021-12-29: 1000 mg via ORAL
  Filled 2021-12-29: qty 2

## 2021-12-29 NOTE — ED Provider Notes (Signed)
[facility] EMERGENCY DEPT Provider Note   CSN: [id number] Arrival date & time: 12/28/21  2130     History  Chief Complaint  Patient presents with   Headache    Kathleen Swanson is a 26 y.o. female.  HPI     This is a 26 year old female who presents with headache.  Patient reports 2-day history of migraine type symptoms.  She states she has a history of migraines.  She states she has taking Tylenol at home with minimal relief.  She does report some chills and cough.  She reports photosensitivity.  No weakness, numbness, strokelike symptoms.  She states nature of the pain is similar to her prior migraines.  No known sick contacts.  No noted fevers at home.  Home Medications Prior to Admission medications   Medication Sig Start Date End Date Taking? Authorizing Provider  cetirizine (ZYRTEC ALLERGY) 10 MG tablet Take 1 tablet (10 mg total) by mouth daily. 02/17/20   Roxy Horseman, PA-C  hydrOXYzine (VISTARIL) 50 MG capsule Take 50 mg by mouth 2 (two) times daily as needed for anxiety.     [provider]  ondansetron (ZOFRAN) 4 MG tablet Take 1 tablet (4 mg total) by mouth every 8 (eight) hours as needed for nausea or vomiting. 09/26/19   Devoria Albe, MD  valACYclovir (VALTREX) 500 MG tablet Take 500 mg by mouth daily.    [provider]      Allergies    Metronidazole    Review of Systems   Review of Systems  Constitutional:  Positive for chills.  Eyes:  Positive for photophobia.  Respiratory:  Positive for cough.   Neurological:  Positive for headaches.  All other systems reviewed and are negative.   Physical Exam Updated Vital Signs BP 111/84   Pulse (!) 110   Temp (!) 102.1 F (38.9 C) (Oral)   Resp 18   Ht 1.626 m (5\' 4" )   Wt 75.8 kg   SpO2 99%   BMI 28.68 kg/m  Physical Exam Vitals and nursing note reviewed.  Constitutional:      Appearance: She is well-developed. She is not ill-appearing.  HENT:     Head: Normocephalic and  atraumatic.  Eyes:     Pupils: Pupils are equal, round, and reactive to light.  Neck:     Comments: No meningismus Cardiovascular:     Rate and Rhythm: Regular rhythm. Tachycardia present.     Heart sounds: Normal heart sounds.  Pulmonary:     Effort: Pulmonary effort is normal. No respiratory distress.     Breath sounds: No wheezing.  Abdominal:     General: Bowel sounds are normal.     Palpations: Abdomen is soft.  Musculoskeletal:     Cervical back: Normal range of motion and neck supple.  Skin:    General: Skin is warm and dry.  Neurological:     Mental Status: She is alert and oriented to person, place, and time.     Comments: Cranial nerves II through XII intact, 5 out of 5 strength in all 4 extremities, no dysmetria to finger-nose-finger  Psychiatric:        Mood and Affect: Mood normal.     ED Results / Procedures / Treatments   Labs (all labs ordered are listed, but only abnormal results are displayed) Labs Reviewed  RESP PANEL BY RT-PCR (FLU A&B, COVID) ARPGX2 - Abnormal; Notable for the following components:      Result Value   SARS Coronavirus  2 by RT PCR POSITIVE (*)    All other components within normal limits  URINALYSIS, ROUTINE W REFLEX MICROSCOPIC - Abnormal; Notable for the following components:   Protein, ur 30 (*)    All other components within normal limits  PREGNANCY, URINE    EKG None  Radiology No results found.  Procedures Procedures    Medications Ordered in ED Medications  promethazine (PHENERGAN) injection 25 mg (25 mg Intramuscular Given 12/29/21 0020)  ketorolac (TORADOL) 30 MG/ML injection 30 mg (30 mg Intramuscular Given 12/29/21 0018)  acetaminophen (TYLENOL) tablet 1,000 mg (1,000 mg Oral Given 12/29/21 0046)    ED Course/ Medical Decision Making/ A&P                           Medical Decision Making Risk OTC drugs. Prescription drug management.   This patient presents to the ED for concern of headache, this involves an  extensive number of treatment options, and is a complaint that carries with it a high risk of complications and morbidity.  I considered the following differential and admission for this acute, potentially life threatening condition.  The differential diagnosis includes migraine, tension headache, viral URI causing headache, less likely meningitis  MDM:    This is a 26 year old female who presents with headache and upper respiratory symptoms.  She is overall nontoxic and vital signs are notable for heart rate of 125.  Temperature 99.5.  She does report some upper respiratory symptoms but mostly complains of headache typical of her migraines.  No red flags.  Neurologic exam is normal.  She was given a migraine cocktail.  COVID and influenza testing were sent.  She is COVID-positive.  She is unvaccinated.  We discussed quarantine precautions.  This is likely the provoking factor for her headache.  She was noted ultimately to mount a temperature to 102.1.  She was given Tylenol.  She has no meningismus on exam.  Less likely meningitis at this point.  We discussed supportive measures.  (Labs, imaging, consults)  Labs: I Ordered, and personally interpreted labs.  The pertinent results include: COVID and influenza testing, urinalysis  Imaging Studies ordered: I ordered imaging studies including none I independently visualized and interpreted imaging. I agree with the radiologist interpretation  Additional history obtained from chart review.  External records from outside source obtained and reviewed including prior evaluations  Cardiac Monitoring: The patient was maintained on a cardiac monitor.  I personally viewed and interpreted the cardiac monitored which showed an underlying rhythm of: Sinus tachycardia  Reevaluation: After the interventions noted above, I reevaluated the patient and found that they have :improved  Social Determinants of Health: Lives independently  Disposition:  Discharge  Co morbidities that complicate the patient evaluation  Past Medical History:  Diagnosis Date   Anxiety    Migraine      Medicines Meds ordered this encounter  Medications   ketorolac (TORADOL) 30 MG/ML injection 30 mg   promethazine (PHENERGAN) injection 25 mg   acetaminophen (TYLENOL) tablet 1,000 mg    I have reviewed the patients home medicines and have made adjustments as needed  Problem List / ED Course: Problem List Items Addressed This Visit   None Visit Diagnoses     Other migraine without status migrainosus, not intractable    -  Primary   Relevant Medications   ketorolac (TORADOL) 30 MG/ML injection 30 mg (Completed)   acetaminophen (TYLENOL) tablet 1,000 mg (Completed)   COVID-19  Final Clinical Impression(s) / ED Diagnoses Final diagnoses:  Other migraine without status migrainosus, not intractable  COVID-19    Rx / DC Orders ED Discharge Orders     None         Jesus Nevills, Mayer Masker, MD 12/29/21 0136

## 2021-12-29 NOTE — Discharge Instructions (Signed)
You were seen today for headache.  You were also noted to have fever and upper respiratory symptoms.  You tested positive for COVID-19.  Given that you are unvaccinated, you should quarantine for 10 days from onset of symptoms.  Take Tylenol or ibuprofen as needed for fever.  Make sure that you are staying hydrated.

## 2021-12-29 NOTE — ED Notes (Signed)
Pt discharged home after verbalizing understanding of discharge instructions; nad noted. 

## 2022-03-10 IMAGING — DX DG CHEST 2V
1 series · 1 of 1 positions shown · non-contrast
Comparison: Radiograph 05/31/2020

CLINICAL DATA: Chest pain

EXAM:
CHEST - 2 VIEW

[chest lat]
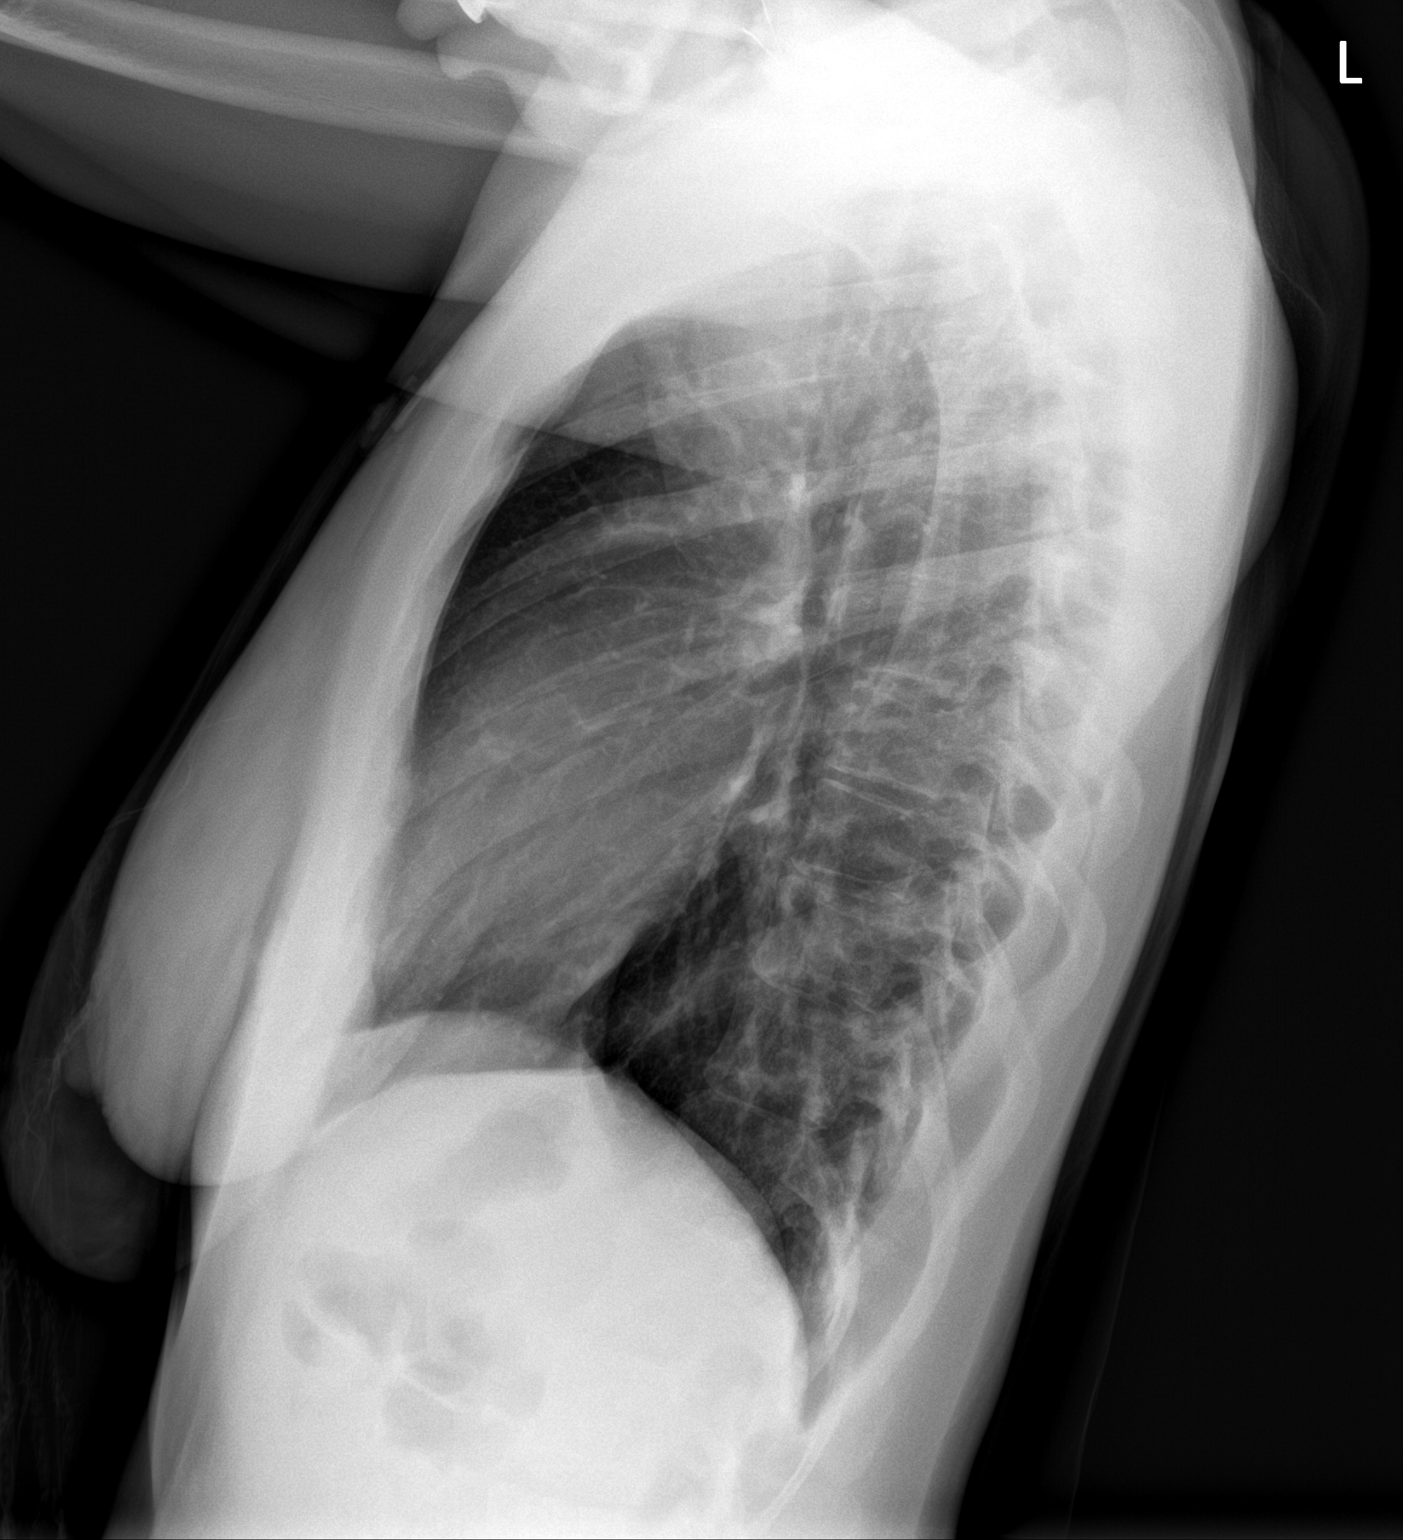

[1 of 1 positions shown; findings below may reference images not displayed]

FINDINGS: No consolidation, features of edema, pneumothorax, or effusion.
Pulmonary vascularity is normally distributed. The cardiomediastinal
contours are unremarkable. No acute osseous or soft tissue
abnormality.
IMPRESSION: No acute cardiopulmonary abnormality.

## 2022-05-01 ENCOUNTER — Emergency Department (HOSPITAL_BASED_OUTPATIENT_CLINIC_OR_DEPARTMENT_OTHER)
Admission: EM | Admit: 2022-05-01 | Discharge: 2022-05-01 | Disposition: A | Discharge status: Home / Self Care | Payer: Medicaid Other | Attending: Emergency Medicine | Admitting: Emergency Medicine

## 2022-05-01 ENCOUNTER — Encounter (HOSPITAL_BASED_OUTPATIENT_CLINIC_OR_DEPARTMENT_OTHER): Payer: Self-pay

## 2022-05-01 ENCOUNTER — Other Ambulatory Visit: Payer: Self-pay

## 2022-05-01 DIAGNOSIS — G43909 Migraine, unspecified, not intractable, without status migrainosus: Secondary | ICD-10-CM | POA: Diagnosis not present

## 2022-05-01 DIAGNOSIS — R519 Headache, unspecified: Secondary | ICD-10-CM | POA: Diagnosis present

## 2022-05-01 MED ORDER — PROMETHAZINE HCL 25 MG/ML IJ SOLN
25.0000 mg | Freq: Once | INTRAMUSCULAR | Status: AC
  Administered 2022-05-01: 25 mg via INTRAMUSCULAR
  Filled 2022-05-01: qty 1

## 2022-05-01 MED ORDER — KETOROLAC TROMETHAMINE 60 MG/2ML IM SOLN
60.0000 mg | Freq: Once | INTRAMUSCULAR | Status: AC
  Administered 2022-05-01: 60 mg via INTRAMUSCULAR
  Filled 2022-05-01: qty 2

## 2022-05-01 NOTE — Discharge Instructions (Signed)
Return to the emergency department if your symptoms significantly worsen or change. °

## 2022-05-01 NOTE — ED Provider Notes (Signed)
[place] EMERGENCY DEPT Provider Note   CSN: [id number] Arrival date & time: 05/01/22  0204     History  Chief Complaint  Patient presents with   Migraine    Kathleen Swanson is a 27 y.o. female.  Patient is a 27 year old female with past medical history of migraine headaches.  Patient presenting today with complaints of headache she describes pain to the top of her head with no visual disturbances, weakness, numbness, fever, or stiff neck.  She has been trying her home medications with little relief.  This feels similar to her prior migraines.  The history is provided by the patient.       Home Medications Prior to Admission medications   Medication Sig Start Date End Date Taking? Authorizing Provider  cetirizine (ZYRTEC ALLERGY) 10 MG tablet Take 1 tablet (10 mg total) by mouth daily. 02/17/20   Montine Circle, PA-C  hydrOXYzine (VISTARIL) 50 MG capsule Take 50 mg by mouth 2 (two) times daily as needed for anxiety.     [provider]  ondansetron (ZOFRAN) 4 MG tablet Take 1 tablet (4 mg total) by mouth every 8 (eight) hours as needed for nausea or vomiting. 09/26/19   Rolland Porter, MD  valACYclovir (VALTREX) 500 MG tablet Take 500 mg by mouth daily.    [provider]      Allergies    Metronidazole    Review of Systems   Review of Systems  All other systems reviewed and are negative.   Physical Exam Updated Vital Signs BP 111/79 (BP Location: Right Arm)   Pulse 95   Temp 98.3 F (36.8 C)   Resp 17   Ht 5\' 4"  (1.626 m)   Wt 63.5 kg   SpO2 98%   BMI 24.03 kg/m  Physical Exam Vitals and nursing note reviewed.  Constitutional:      General: She is not in acute distress.    Appearance: She is well-developed. She is not diaphoretic.  HENT:     Head: Normocephalic and atraumatic.  Eyes:     Extraocular Movements: Extraocular movements intact.     Pupils: Pupils are equal, round, and reactive to light.  Cardiovascular:     Rate  and Rhythm: Normal rate and regular rhythm.     Heart sounds: No murmur heard.    No friction rub. No gallop.  Pulmonary:     Effort: Pulmonary effort is normal. No respiratory distress.     Breath sounds: Normal breath sounds. No wheezing.  Abdominal:     General: Bowel sounds are normal. There is no distension.     Palpations: Abdomen is soft.     Tenderness: There is no abdominal tenderness.  Musculoskeletal:        General: Normal range of motion.     Cervical back: Normal range of motion and neck supple.  Skin:    General: Skin is warm and dry.  Neurological:     General: No focal deficit present.     Mental Status: She is alert and oriented to person, place, and time.     Cranial Nerves: No cranial nerve deficit.     Sensory: No sensory deficit.     Motor: No weakness.     Coordination: Coordination normal.     ED Results / Procedures / Treatments   Labs (all labs ordered are listed, but only abnormal results are displayed) Labs Reviewed - No data to display  EKG None  Radiology No results found.  Procedures Procedures    Medications Ordered in ED Medications  ketorolac (TORADOL) injection 60 mg (has no administration in time range)  promethazine (PHENERGAN) injection 25 mg (has no administration in time range)    ED Course/ Medical Decision Making/ A&P  Patient presenting with [place] Abts of headache as described in the HPI.  She has been seen on other occasions with similar complaints.  Today's headache is consistent with her prior migraines.  Patient given Toradol and Phenergan and is now feeling much better and wants to go home.  Patient is neurologically intact and is well-appearing.  I highly doubt any acute pathology and do not feel as though imaging or LP is indicated.  Patient to return as needed for any problems.  Final Clinical Impression(s) / ED Diagnoses Final diagnoses:  None    Rx / DC Orders ED Discharge Orders     None         Veryl Speak, MD 05/01/22 [phone]

## 2022-05-01 NOTE — ED Triage Notes (Addendum)
Patient arrives to ED POV c/o migraine x3 days. Per pt she has taken OTC medications with no relief. Denies blurred vision and nausea. No other complaints at this time. Pt A/O x4.

## 2022-05-26 ENCOUNTER — Encounter: Payer: Medicaid Other | Admitting: Radiology

## 2022-06-04 ENCOUNTER — Telehealth: Payer: Self-pay | Admitting: *Deleted

## 2022-06-04 ENCOUNTER — Ambulatory Visit (INDEPENDENT_AMBULATORY_CARE_PROVIDER_SITE_OTHER): Payer: Medicaid Other | Admitting: Radiology

## 2022-06-04 ENCOUNTER — Encounter: Payer: Self-pay | Admitting: Radiology

## 2022-06-04 ENCOUNTER — Other Ambulatory Visit (HOSPITAL_COMMUNITY)
Admission: RE | Admit: 2022-06-04 | Discharge: 2022-06-04 | Disposition: A | Discharge status: Home / Self Care | Payer: Medicaid Other | Source: Ambulatory Visit | Attending: Radiology | Admitting: Radiology

## 2022-06-04 VITALS — BP 120/76 | Ht 63.5 in | Wt 151.0 lb

## 2022-06-04 DIAGNOSIS — Z01419 Encounter for gynecological examination (general) (routine) without abnormal findings: Secondary | ICD-10-CM | POA: Diagnosis not present

## 2022-06-04 DIAGNOSIS — N6310 Unspecified lump in the right breast, unspecified quadrant: Secondary | ICD-10-CM

## 2022-06-04 DIAGNOSIS — N6315 Unspecified lump in the right breast, overlapping quadrants: Secondary | ICD-10-CM | POA: Diagnosis not present

## 2022-06-04 DIAGNOSIS — Z113 Encounter for screening for infections with a predominantly sexual mode of transmission: Secondary | ICD-10-CM | POA: Diagnosis not present

## 2022-06-04 NOTE — Telephone Encounter (Signed)
U/S scheduled 06/10/2022 at 10:30 am.Pt aware of appt

## 2022-06-04 NOTE — Progress Notes (Signed)
Kathleen Swanson Apr 07, 1996 [id number]   History:  27 y.o. G4P3 presents for annual exam.Complains of right breast lump. Breast biopsy few years ago benign. Patient desires removal.   Gynecologic History Patient's last menstrual period was 05/07/2022 (approximate). Period Cycle (Days): 28 Period Duration (Days): 4 Period Pattern: Regular Menstrual Flow: Heavy, Light (heavy first few days) Menstrual Control: Tampon, Maxi pad Dysmenorrhea: (!) Moderate Dysmenorrhea Symptoms: Cramping Contraception/Family planning: tubal ligation Sexually active: yes Last Pap: 2022. Results were: abnormal- per pt with negative colpo, no records   Obstetric History OB History  Gravida Para Term Preterm AB Living  4 3 3 $ 0 1 3  SAB IAB Ectopic Multiple Live Births  1   0 0 3    # Outcome Date GA Lbr Len/2nd Weight Sex Delivery Anes PTL Lv  4 SAB           3 Term           2 Term           1 Term              The following portions of the patient's history were reviewed and updated as appropriate: allergies, current medications, past family history, past medical history, past social history, past surgical history, and problem list.  Review of Systems Pertinent items noted in HPI and remainder of comprehensive ROS otherwise negative.   Past medical history, past surgical history, family history and social history were all reviewed and documented in the EPIC chart.   Exam:  Vitals:   06/04/22 1025  BP: 120/76  Weight: 151 lb (68.5 kg)  Height: 5' 3.5" (1.613 m)   Body mass index is 26.33 kg/m.  General appearance:  Normal Thyroid:  Symmetrical, normal in size, without palpable masses or nodularity. Respiratory  Auscultation:  Clear without wheezing or rhonchi Cardiovascular  Auscultation:  Regular rate, without rubs, murmurs or gallops  Edema/varicosities:  Not grossly evident Abdominal  Soft,nontender, without masses, guarding or rebound.  Liver/spleen:  No organomegaly  noted  Hernia:  None appreciated  Skin  Inspection:  Grossly normal Breasts: Examined lying and sitting.   Right: 1.5cm mobile mass 9 oclock. Without retractions, nipple discharge or axillary adenopathy.   Left: Without masses, retractions, nipple discharge or axillary adenopathy. Genitourinary   Inguinal/mons:  Normal without inguinal adenopathy  External genitalia:  Normal appearing vulva with no masses, tenderness, or lesions  BUS/Urethra/Skene's glands:  Normal without masses or exudate  Vagina:  Normal appearing with normal color and discharge, no lesions  Cervix:  Normal appearing without discharge or lesions  Uterus:  Normal in size, shape and contour.  Mobile, nontender  Adnexa/parametria:     Rt: Normal in size, without masses or tenderness.   Lt: Normal in size, without masses or tenderness.  Anus and perineum: Normal   Patient informed chaperone available to be present for breast and pelvic exam. Patient has requested no chaperone to be present. Patient has been advised what will be completed during breast and pelvic exam.   Assessment/Plan:   1. Well woman exam with routine gynecological exam - Cytology - PAP( [facility])  2. Screening for STDs (sexually transmitted diseases) - HIV antibody (with reflex) - RPR - Hepatitis B Surface AntiGEN - Hepatitis C antibody - Cytology - PAP( )  3. Mass overlapping multiple quadrants of right breast Dx ultrasound   Discussed SBE, colonoscopy and DEXA screening as directed/appropriate. Recommend 162mns of exercise weekly, including weight bearing exercise. Encouraged  the use of seatbelts and sunscreen. Return in 1 year for annual or as needed.   Rubbie Battiest B WHNP-BC 10:56 AM 06/04/2022

## 2022-06-04 NOTE — Telephone Encounter (Signed)
-----   Message from Kerry Dory, NP sent at 06/04/2022 11:01 AM EST ----- Regarding: Breast mass Please schedule u/s at [facility] (pt preference) for right breast mass 9 oclock, mobile, non tender.

## 2022-06-05 ENCOUNTER — Other Ambulatory Visit: Payer: Medicaid Other

## 2022-06-05 LAB — HEPATITIS B SURFACE ANTIGEN: Hepatitis B Surface Ag: NONREACTIVE

## 2022-06-05 LAB — HIV ANTIBODY (ROUTINE TESTING W REFLEX): HIV 1&2 Ab, 4th Generation: NONREACTIVE

## 2022-06-05 LAB — RPR: RPR Ser Ql: NONREACTIVE

## 2022-06-05 LAB — HEPATITIS C ANTIBODY: Hepatitis C Ab: NONREACTIVE

## 2022-06-08 LAB — CYTOLOGY - PAP
Adequacy: ABSENT
Chlamydia: NEGATIVE
Comment: NEGATIVE
Comment: NEGATIVE
Comment: NORMAL
Diagnosis: NEGATIVE
Neisseria Gonorrhea: NEGATIVE
Trichomonas: NEGATIVE

## 2022-06-10 ENCOUNTER — Other Ambulatory Visit: Payer: Medicaid Other

## 2022-06-18 ENCOUNTER — Ambulatory Visit
Admission: RE | Admit: 2022-06-18 | Discharge: 2022-06-18 | Disposition: A | Discharge status: Home / Self Care | Payer: Medicaid Other | Source: Ambulatory Visit | Attending: Radiology | Admitting: Radiology

## 2022-06-18 DIAGNOSIS — N6310 Unspecified lump in the right breast, unspecified quadrant: Secondary | ICD-10-CM

## 2022-07-07 ENCOUNTER — Ambulatory Visit: Payer: Self-pay | Admitting: General Surgery

## 2022-07-07 DIAGNOSIS — D241 Benign neoplasm of right breast: Secondary | ICD-10-CM | POA: Insufficient documentation

## 2022-07-07 MED ORDER — KETOROLAC TROMETHAMINE 15 MG/ML IJ SOLN: 15.0000 mg | Freq: Once | INTRAMUSCULAR | Status: AC

## 2022-08-04 ENCOUNTER — Other Ambulatory Visit: Payer: Self-pay

## 2022-08-04 ENCOUNTER — Encounter (HOSPITAL_BASED_OUTPATIENT_CLINIC_OR_DEPARTMENT_OTHER): Payer: Self-pay | Admitting: General Surgery

## 2022-08-10 DIAGNOSIS — Z01818 Encounter for other preprocedural examination: Secondary | ICD-10-CM

## 2022-08-31 ENCOUNTER — Other Ambulatory Visit: Payer: Self-pay

## 2022-08-31 ENCOUNTER — Encounter (HOSPITAL_BASED_OUTPATIENT_CLINIC_OR_DEPARTMENT_OTHER): Payer: Self-pay | Admitting: General Surgery

## 2022-08-31 MED ORDER — CHLORHEXIDINE GLUCONATE CLOTH 2 % EX PADS: 6.0000 | MEDICATED_PAD | Freq: Once | CUTANEOUS | Status: DC

## 2022-08-31 NOTE — Progress Notes (Signed)

## 2022-09-04 ENCOUNTER — Other Ambulatory Visit: Payer: Self-pay

## 2022-09-04 ENCOUNTER — Encounter (HOSPITAL_BASED_OUTPATIENT_CLINIC_OR_DEPARTMENT_OTHER)
Admission: RE | Disposition: A | Discharge status: Home / Self Care | Payer: Self-pay | Source: Home / Self Care | Attending: General Surgery

## 2022-09-04 ENCOUNTER — Ambulatory Visit (HOSPITAL_BASED_OUTPATIENT_CLINIC_OR_DEPARTMENT_OTHER)
Admission: RE | Admit: 2022-09-04 | Discharge: 2022-09-04 | Disposition: A | Discharge status: Home / Self Care | Payer: Medicaid Other | Attending: General Surgery | Admitting: General Surgery

## 2022-09-04 ENCOUNTER — Ambulatory Visit (HOSPITAL_BASED_OUTPATIENT_CLINIC_OR_DEPARTMENT_OTHER): Payer: Medicaid Other | Admitting: Anesthesiology

## 2022-09-04 ENCOUNTER — Encounter (HOSPITAL_BASED_OUTPATIENT_CLINIC_OR_DEPARTMENT_OTHER): Payer: Self-pay | Admitting: General Surgery

## 2022-09-04 DIAGNOSIS — D241 Benign neoplasm of right breast: Secondary | ICD-10-CM | POA: Diagnosis present

## 2022-09-04 DIAGNOSIS — F419 Anxiety disorder, unspecified: Secondary | ICD-10-CM | POA: Insufficient documentation

## 2022-09-04 DIAGNOSIS — Z87891 Personal history of nicotine dependence: Secondary | ICD-10-CM | POA: Insufficient documentation

## 2022-09-04 DIAGNOSIS — R920 Mammographic microcalcification found on diagnostic imaging of breast: Secondary | ICD-10-CM | POA: Insufficient documentation

## 2022-09-04 DIAGNOSIS — Z01818 Encounter for other preprocedural examination: Secondary | ICD-10-CM

## 2022-09-04 DIAGNOSIS — K219 Gastro-esophageal reflux disease without esophagitis: Secondary | ICD-10-CM | POA: Diagnosis not present

## 2022-09-04 DIAGNOSIS — Z803 Family history of malignant neoplasm of breast: Secondary | ICD-10-CM | POA: Diagnosis not present

## 2022-09-04 HISTORY — PX: BREAST LUMPECTOMY: SHX2

## 2022-09-04 LAB — POCT PREGNANCY, URINE: Preg Test, Ur: NEGATIVE

## 2022-09-04 SURGERY — BREAST LUMPECTOMY
Anesthesia: General | Site: Breast | Laterality: Right

## 2022-09-04 MED ORDER — LIDOCAINE 2% (20 MG/ML) 5 ML SYRINGE
INTRAMUSCULAR | Status: AC
  Filled 2022-09-04: qty 5

## 2022-09-04 MED ORDER — PROPOFOL 10 MG/ML IV BOLUS
INTRAVENOUS | Status: DC | PRN
  Administered 2022-09-04: 200 mg via INTRAVENOUS

## 2022-09-04 MED ORDER — OXYCODONE HCL 5 MG PO TABS
5.0000 mg | ORAL_TABLET | Freq: Once | ORAL | Status: AC | PRN
  Administered 2022-09-04: 5 mg via ORAL

## 2022-09-04 MED ORDER — KETOROLAC TROMETHAMINE 30 MG/ML IJ SOLN
INTRAMUSCULAR | Status: DC | PRN
  Administered 2022-09-04: 30 mg via INTRAVENOUS

## 2022-09-04 MED ORDER — MIDAZOLAM HCL 5 MG/5ML IJ SOLN
INTRAMUSCULAR | Status: DC | PRN
  Administered 2022-09-04: 2 mg via INTRAVENOUS

## 2022-09-04 MED ORDER — ACETAMINOPHEN 500 MG PO TABS
1000.0000 mg | ORAL_TABLET | Freq: Once | ORAL | Status: AC
  Administered 2022-09-04: 1000 mg via ORAL

## 2022-09-04 MED ORDER — SCOPOLAMINE 1 MG/3DAYS TD PT72
MEDICATED_PATCH | TRANSDERMAL | Status: AC
  Filled 2022-09-04: qty 1

## 2022-09-04 MED ORDER — BUPIVACAINE-EPINEPHRINE (PF) 0.25% -1:200000 IJ SOLN
INTRAMUSCULAR | Status: AC
  Filled 2022-09-04: qty 30

## 2022-09-04 MED ORDER — LACTATED RINGERS IV SOLN: INTRAVENOUS | Status: DC

## 2022-09-04 MED ORDER — OXYCODONE HCL 5 MG PO TABS
ORAL_TABLET | ORAL | Status: AC
  Filled 2022-09-04: qty 1

## 2022-09-04 MED ORDER — OXYCODONE HCL 5 MG/5ML PO SOLN: 5.0000 mg | Freq: Once | ORAL | Status: AC | PRN

## 2022-09-04 MED ORDER — MEPERIDINE HCL 25 MG/ML IJ SOLN: 6.2500 mg | INTRAMUSCULAR | Status: DC | PRN

## 2022-09-04 MED ORDER — ACETAMINOPHEN 500 MG PO TABS
ORAL_TABLET | ORAL | Status: AC
  Filled 2022-09-04: qty 2

## 2022-09-04 MED ORDER — DEXAMETHASONE SODIUM PHOSPHATE 10 MG/ML IJ SOLN
INTRAMUSCULAR | Status: AC
  Filled 2022-09-04: qty 1

## 2022-09-04 MED ORDER — CEFAZOLIN SODIUM-DEXTROSE 2-4 GM/100ML-% IV SOLN
INTRAVENOUS | Status: AC
  Filled 2022-09-04: qty 100

## 2022-09-04 MED ORDER — CEFAZOLIN SODIUM-DEXTROSE 2-4 GM/100ML-% IV SOLN: 2.0000 g | INTRAVENOUS | Status: DC

## 2022-09-04 MED ORDER — AMISULPRIDE (ANTIEMETIC) 5 MG/2ML IV SOLN: 10.0000 mg | Freq: Once | INTRAVENOUS | Status: DC | PRN

## 2022-09-04 MED ORDER — ACETAMINOPHEN 500 MG PO TABS: 1000.0000 mg | ORAL_TABLET | Freq: Once | ORAL | Status: AC

## 2022-09-04 MED ORDER — FENTANYL CITRATE (PF) 100 MCG/2ML IJ SOLN
INTRAMUSCULAR | Status: DC | PRN
  Administered 2022-09-04 (×2): 50 ug via INTRAVENOUS

## 2022-09-04 MED ORDER — SUCCINYLCHOLINE CHLORIDE 200 MG/10ML IV SOSY
PREFILLED_SYRINGE | INTRAVENOUS | Status: AC
  Filled 2022-09-04: qty 10

## 2022-09-04 MED ORDER — KETOROLAC TROMETHAMINE 30 MG/ML IJ SOLN: 30.0000 mg | Freq: Once | INTRAMUSCULAR | Status: DC | PRN

## 2022-09-04 MED ORDER — ONDANSETRON HCL 4 MG/2ML IJ SOLN
INTRAMUSCULAR | Status: DC | PRN
  Administered 2022-09-04: 4 mg via INTRAVENOUS

## 2022-09-04 MED ORDER — DEXAMETHASONE SODIUM PHOSPHATE 4 MG/ML IJ SOLN
INTRAMUSCULAR | Status: DC | PRN
  Administered 2022-09-04: 5 mg via INTRAVENOUS

## 2022-09-04 MED ORDER — FENTANYL CITRATE (PF) 100 MCG/2ML IJ SOLN
INTRAMUSCULAR | Status: AC
  Filled 2022-09-04: qty 2

## 2022-09-04 MED ORDER — ONDANSETRON HCL 4 MG/2ML IJ SOLN: 4.0000 mg | Freq: Once | INTRAMUSCULAR | Status: DC | PRN

## 2022-09-04 MED ORDER — LIDOCAINE 2% (20 MG/ML) 5 ML SYRINGE
INTRAMUSCULAR | Status: DC | PRN
  Administered 2022-09-04: 80 mg via INTRAVENOUS

## 2022-09-04 MED ORDER — GABAPENTIN 300 MG PO CAPS
ORAL_CAPSULE | ORAL | Status: AC
  Filled 2022-09-04: qty 1

## 2022-09-04 MED ORDER — HYDROMORPHONE HCL 1 MG/ML IJ SOLN: 0.2500 mg | INTRAMUSCULAR | Status: DC | PRN

## 2022-09-04 MED ORDER — SCOPOLAMINE 1 MG/3DAYS TD PT72
1.0000 | MEDICATED_PATCH | TRANSDERMAL | Status: DC
  Administered 2022-09-04: 1.5 mg via TRANSDERMAL

## 2022-09-04 MED ORDER — ACETAMINOPHEN 500 MG PO TABS: 1000.0000 mg | ORAL_TABLET | ORAL | Status: AC

## 2022-09-04 MED ORDER — EPHEDRINE 5 MG/ML INJ
INTRAVENOUS | Status: AC
  Filled 2022-09-04: qty 5

## 2022-09-04 MED ORDER — ATROPINE SULFATE 0.4 MG/ML IV SOLN
INTRAVENOUS | Status: AC
  Filled 2022-09-04: qty 1

## 2022-09-04 MED ORDER — ONDANSETRON HCL 4 MG/2ML IJ SOLN
INTRAMUSCULAR | Status: AC
  Filled 2022-09-04: qty 2

## 2022-09-04 MED ORDER — BUPIVACAINE-EPINEPHRINE 0.25% -1:200000 IJ SOLN
INTRAMUSCULAR | Status: DC | PRN
  Administered 2022-09-04: 20 mL

## 2022-09-04 MED ORDER — GABAPENTIN 300 MG PO CAPS
300.0000 mg | ORAL_CAPSULE | ORAL | Status: AC
  Administered 2022-09-04: 300 mg via ORAL

## 2022-09-04 MED ORDER — MIDAZOLAM HCL 2 MG/2ML IJ SOLN
INTRAMUSCULAR | Status: AC
  Filled 2022-09-04: qty 2

## 2022-09-04 MED ORDER — OXYCODONE HCL 5 MG PO TABS
5.0000 mg | ORAL_TABLET | Freq: Four times a day (QID) | ORAL | 0 refills | Status: DC | PRN
Start: 2022-09-04 — End: 2023-12-06

## 2022-09-04 MED ORDER — PHENYLEPHRINE 80 MCG/ML (10ML) SYRINGE FOR IV PUSH (FOR BLOOD PRESSURE SUPPORT)
PREFILLED_SYRINGE | INTRAVENOUS | Status: AC
  Filled 2022-09-04: qty 10

## 2022-09-04 SURGICAL SUPPLY — 39 items
ADH SKN CLS APL DERMABOND .7 (GAUZE/BANDAGES/DRESSINGS) ×1
APL PRP STRL LF DISP 70% ISPRP (MISCELLANEOUS) ×1
APPLIER CLIP 9.375 MED OPEN (MISCELLANEOUS)
APR CLP MED 9.3 20 MLT OPN (MISCELLANEOUS)
BLADE SURG 15 STRL LF DISP TIS (BLADE) ×1 IMPLANT
BLADE SURG 15 STRL SS (BLADE) ×1
CANISTER SUCT 1200ML W/VALVE (MISCELLANEOUS) ×1 IMPLANT
CHLORAPREP W/TINT 26 (MISCELLANEOUS) ×1 IMPLANT
CLIP APPLIE 9.375 MED OPEN (MISCELLANEOUS) IMPLANT
COVER BACK TABLE 60X90IN (DRAPES) ×1 IMPLANT
COVER MAYO STAND STRL (DRAPES) ×1 IMPLANT
DERMABOND ADVANCED .7 DNX12 (GAUZE/BANDAGES/DRESSINGS) ×1 IMPLANT
DRAPE LAPAROSCOPIC ABDOMINAL (DRAPES) ×1 IMPLANT
DRAPE UTILITY XL STRL (DRAPES) ×1 IMPLANT
ELECT BLADE 4.0 EZ CLEAN MEGAD (MISCELLANEOUS)
ELECT COATED BLADE 2.86 ST (ELECTRODE) ×1 IMPLANT
ELECT REM PT RETURN 9FT ADLT (ELECTROSURGICAL) ×1
ELECTRODE BLDE 4.0 EZ CLN MEGD (MISCELLANEOUS) IMPLANT
ELECTRODE REM PT RTRN 9FT ADLT (ELECTROSURGICAL) ×1 IMPLANT
GLOVE BIO SURGEON STRL SZ7.5 (GLOVE) ×1 IMPLANT
GOWN STRL REUS W/ TWL LRG LVL3 (GOWN DISPOSABLE) ×2 IMPLANT
GOWN STRL REUS W/TWL LRG LVL3 (GOWN DISPOSABLE) ×2
KIT MARKER MARGIN INK (KITS) IMPLANT
NDL HYPO 25X1 1.5 SAFETY (NEEDLE) ×1 IMPLANT
NEEDLE HYPO 25X1 1.5 SAFETY (NEEDLE) ×1 IMPLANT
NS IRRIG 1000ML POUR BTL (IV SOLUTION) ×1 IMPLANT
PACK BASIN DAY SURGERY FS (CUSTOM PROCEDURE TRAY) ×1 IMPLANT
PENCIL SMOKE EVACUATOR (MISCELLANEOUS) ×1 IMPLANT
SLEEVE SCD COMPRESS KNEE MED (STOCKING) ×1 IMPLANT
SPIKE FLUID TRANSFER (MISCELLANEOUS) ×1 IMPLANT
SPONGE T-LAP 18X18 ~~LOC~~+RFID (SPONGE) ×1 IMPLANT
SUT MON AB 4-0 PC3 18 (SUTURE) ×1 IMPLANT
SUT SILK 2 0 SH (SUTURE) ×1 IMPLANT
SUT VICRYL 3-0 CR8 SH (SUTURE) ×1 IMPLANT
SYR CONTROL 10ML LL (SYRINGE) ×1 IMPLANT
TOWEL GREEN STERILE FF (TOWEL DISPOSABLE) ×1 IMPLANT
TRAY FAXITRON CT DISP (TRAY / TRAY PROCEDURE) IMPLANT
TUBE CONNECTING 20X1/4 (TUBING) ×1 IMPLANT
YANKAUER SUCT BULB TIP NO VENT (SUCTIONS) ×1 IMPLANT

## 2022-09-04 NOTE — Anesthesia Postprocedure Evaluation (Signed)
Anesthesia Post Note  Patient: Kathleen Swanson  Procedure(s) Performed: RIGHT BREAST LUMPECTOMY (Right: Breast)     Patient location during evaluation: PACU Anesthesia Type: General Level of consciousness: awake and alert Pain management: pain level controlled Vital Signs Assessment: post-procedure vital signs reviewed and stable Respiratory status: spontaneous breathing, nonlabored ventilation and respiratory function stable Cardiovascular status: blood pressure returned to baseline Postop Assessment: no apparent nausea or vomiting Anesthetic complications: no   No notable events documented.  Last Vitals:  Vitals:   09/04/22 0920 09/04/22 0931  BP:  121/72  Pulse: 66 65  Resp: 16 14  Temp:  36.6 C  SpO2: 100% 98%    Last Pain:  Vitals:   09/04/22 0931  TempSrc: Oral  PainSc: 4                  [name]

## 2022-09-04 NOTE — Transfer of Care (Signed)
Immediate Anesthesia Transfer of Care Note  Patient: Kathleen Swanson  Procedure(s) Performed: RIGHT BREAST LUMPECTOMY (Right: Breast)  Patient Location: PACU  Anesthesia Type:General  Level of Consciousness: awake, alert , oriented, drowsy, and patient cooperative  Airway & Oxygen Therapy: Patient Spontanous Breathing and Patient connected to face mask oxygen  Post-op Assessment: Report given to RN and Post -op Vital signs reviewed and stable  Post vital signs: Reviewed and stable  Last Vitals:  Vitals Value Taken Time  BP    Temp    Pulse 93 09/04/22 0904  Resp    SpO2 98 % 09/04/22 0904  Vitals shown include unvalidated device data.  Last Pain:  Vitals:   09/04/22 0701  TempSrc: Oral      Patients Stated Pain Goal: 4 (09/04/22 0701)  Complications: No notable events documented.

## 2022-09-04 NOTE — Discharge Instructions (Addendum)
  Post Anesthesia Home Care Instructions  Activity: Get plenty of rest for the remainder of the day. A responsible individual must stay with you for 24 hours following the procedure.  For the next 24 hours, DO NOT: -Drive a car -Advertising copywriter -Drink alcoholic beverages -Take any medication unless instructed by your physician -Make any legal decisions or sign important papers.  Meals: Start with liquid foods such as gelatin or soup. Progress to regular foods as tolerated. Avoid greasy, spicy, heavy foods. If nausea and/or vomiting occur, drink only clear liquids until the nausea and/or vomiting subsides. Call your physician if vomiting continues.  Special Instructions/Symptoms: Your throat may feel dry or sore from the anesthesia or the breathing tube placed in your throat during surgery. If this causes discomfort, gargle with warm salt water. The discomfort should disappear within 24 hours.  If you had a scopolamine patch placed behind your ear for the management of post- operative nausea and/or vomiting:  1. The medication in the patch is effective for 72 hours, after which it should be removed.  Wrap patch in a tissue and discard in the trash. Wash hands thoroughly with soap and water. 2. You may remove the patch earlier than 72 hours if you experience unpleasant side effects which may include dry mouth, dizziness or visual disturbances. 3. Avoid touching the patch. Wash your hands with soap and water after contact with the patch.    *May have Tylenol today at 1pm *May have Ibuprofen today at 2:40pm

## 2022-09-04 NOTE — H&P (Signed)
REFERRING PHYSICIAN: Arlie Solomons PROVIDER: Lindell Noe, MD MRN: [id number] DOB: [birth year] Subjective   Chief Complaint: New Consultation (RIGHT Breast Mass)  History of Present Illness: Kathleen Swanson is a 27 y.o. female who is seen today as an office consultation for evaluation of New Consultation (RIGHT Breast Mass)  We are asked to see the patient in consultation by Dr. Cinda Quest to evaluate her for a fibroadenoma in the right breast. The patient is a 27 year old black female who has felt this mass in the outer aspect of her right breast since 2017. She had it biopsied in [place] and was told it was a fibroadenoma. On her recent imaging the mass measures about 2.7 cm. It seems to be unchanged. She denies any breast pain or discharge from the nipple. She is worried about it and would like to have it removed. She does have a family history of breast cancer in a maternal great grandmother. She is otherwise in good health and does not smoke.  Review of Systems: A complete review of systems was obtained from the patient. I have reviewed this information and discussed as appropriate with the patient. See HPI as well for other ROS.  ROS   Medical History: Past Medical History:  Diagnosis Date  Anxiety  GERD (gastroesophageal reflux disease)   Patient Active Problem List  Diagnosis  Fibroadenoma of right breast   Past Surgical History:  Procedure Laterality Date  CESAREAN SECTION  02/2019, 08/2020  TONSILLECTOMY & ADENOIDECTOMY  WISDOM TEETH    Allergies  Allergen Reactions  Metronidazole Itching   No current outpatient medications on file prior to visit.   No current facility-administered medications on file prior to visit.   History reviewed. No pertinent family history.   Social History   Tobacco Use  Smoking Status Former  Types: Cigarettes  Quit date: 2021  Years since quitting: 3.2  Smokeless Tobacco Never    Social History   Socioeconomic  History  Marital status: Single  Tobacco Use  Smoking status: Former  Types: Cigarettes  Quit date: 2021  Years since quitting: 3.2  Smokeless tobacco: Never  Substance and Sexual Activity  Alcohol use: Not Currently  Drug use: Never   Objective:  There were no vitals filed for this visit.  There is no height or weight on file to calculate BMI.  Physical Exam Vitals reviewed.  Constitutional:  General: She is not in acute distress. Appearance: Normal appearance.  HENT:  Head: Normocephalic and atraumatic.  Right Ear: External ear normal.  Left Ear: External ear normal.  Nose: Nose normal.  Mouth/Throat:  Mouth: Mucous membranes are moist.  Pharynx: Oropharynx is clear.  Eyes:  General: No scleral icterus. Extraocular Movements: Extraocular movements intact.  Conjunctiva/sclera: Conjunctivae normal.  Pupils: Pupils are equal, round, and reactive to light.  Cardiovascular:  Rate and Rhythm: Normal rate and regular rhythm.  Pulses: Normal pulses.  Heart sounds: Normal heart sounds.  Pulmonary:  Effort: Pulmonary effort is normal. No respiratory distress.  Breath sounds: Normal breath sounds.  Abdominal:  General: Bowel sounds are normal.  Palpations: Abdomen is soft.  Tenderness: There is no abdominal tenderness.  Musculoskeletal:  General: No swelling, tenderness or deformity. Normal range of motion.  Cervical back: Normal range of motion and neck supple.  Skin: General: Skin is warm and dry.  Coloration: Skin is not jaundiced.  Neurological:  General: No focal deficit present.  Mental Status: She is alert and oriented to person, place, and time.  Psychiatric:  Mood  and Affect: Mood normal.  Behavior: Behavior normal.     Breast: There is a 2 cm round mobile mass in the outer aspect of the right breast. Other than this there is no palpable mass in either breast. There is a small mobile palpable lymph node in each axilla.  Labs, Imaging and Diagnostic  Testing:  Assessment and Plan:   Diagnoses and all orders for this visit:  Fibroadenoma of right breast   The patient appears to have a 2.7 cm fibroadenoma in the outer aspect of the right breast that she would like to have removed. I feel like this is very reasonable. I have discussed with her in detail the risks and benefits of the operation as well as some of the technical aspects and she understands and wishes to proceed. Since it is palpable she would not need any localization prior to surgery.

## 2022-09-04 NOTE — Anesthesia Preprocedure Evaluation (Signed)
Anesthesia Evaluation  Patient identified by MRN, date of birth, ID band Patient awake    Reviewed: Allergy & Precautions, H&P , NPO status , Patient's Chart, lab work & pertinent test results  Airway Mallampati: II  TM Distance: >3 FB Neck ROM: Full    Dental no notable dental hx.    Pulmonary neg pulmonary ROS   Pulmonary exam normal breath sounds clear to auscultation       Cardiovascular negative cardio ROS Normal cardiovascular exam Rhythm:Regular Rate:Normal     Neuro/Psych  Headaches PSYCHIATRIC DISORDERS Anxiety        GI/Hepatic negative GI ROS, Neg liver ROS,,,  Endo/Other  negative endocrine ROS    Renal/GU negative Renal ROS  negative genitourinary   Musculoskeletal negative musculoskeletal ROS (+)    Abdominal   Peds negative pediatric ROS (+)  Hematology negative hematology ROS (+)   Anesthesia Other Findings   Reproductive/Obstetrics negative OB ROS                             Anesthesia Physical Anesthesia Plan  ASA: 1  Anesthesia Plan: General   Post-op Pain Management: Tylenol PO (pre-op)* and Toradol IV (intra-op)*   Induction: Intravenous  PONV Risk Score and Plan: 3 and Ondansetron, Dexamethasone, Midazolam and Treatment may vary due to age or medical condition  Airway Management Planned: LMA  Additional Equipment: None  Intra-op Plan:   Post-operative Plan: Extubation in OR  Informed Consent: I have reviewed the patients History and Physical, chart, labs and discussed the procedure including the risks, benefits and alternatives for the proposed anesthesia with the patient or authorized representative who has indicated his/her understanding and acceptance.     Dental advisory given  Plan Discussed with: CRNA  Anesthesia Plan Comments:        Anesthesia Quick Evaluation

## 2022-09-04 NOTE — Interval H&P Note (Signed)
History and Physical Interval Note:  09/04/2022 8:01 AM  Kathleen Swanson  has presented today for surgery, with the diagnosis of RIGHT BEAST Fibroadenoma.  The various methods of treatment have been discussed with the patient and family. After consideration of risks, benefits and other options for treatment, the patient has consented to  Procedure(s): RIGHT BREAST LUMPECTOMY (Right) as a surgical intervention.  The patient's history has been reviewed, patient examined, no change in status, stable for surgery.  I have reviewed the patient's chart and labs.  Questions were answered to the patient's satisfaction.     Chevis Pretty III

## 2022-09-04 NOTE — Op Note (Signed)
09/04/2022  8:57 AM  PATIENT:  Kathleen Swanson  27 y.o. female  PRE-OPERATIVE DIAGNOSIS:  RIGHT BEAST Fibroadenoma  POST-OPERATIVE DIAGNOSIS:  RIGHT BEAST Fibroadenoma  PROCEDURE:  Procedure(s): RIGHT BREAST LUMPECTOMY (Right)  SURGEON:  Surgeon(s) and Role:    * Griselda Miner, MD - Primary  PHYSICIAN ASSISTANT:   ASSISTANTS: none   ANESTHESIA:   local and general  EBL:  10 mL   BLOOD ADMINISTERED:none  DRAINS: none   LOCAL MEDICATIONS USED:  MARCAINE     SPECIMEN:  Source of Specimen:  right breast tissue  DISPOSITION OF SPECIMEN:  PATHOLOGY  COUNTS:  YES  TOURNIQUET:  * No tourniquets in log *  DICTATION: .Dragon Dictation  After informed consent was obtained the patient was brought to the operating room and placed in the supine position on the operating table.  After adequate induction of general anesthesia the patient's right breast was prepped with ChloraPrep, allowed to dry, and draped in usual sterile manner.  An appropriate timeout was performed.  The patient has a 2 cm palpable mobile mass in the outer aspect of the right breast.  This was previously biopsied and shown to be a fibroadenoma.  Because it has gotten larger over time she would like to have it removed.  The area around it was infiltrated with quarter percent Marcaine.  I elected to make a curvilinear incision along the outer edge of the areola of the right breast with a 15 blade knife.  The incision was carried through the skin and subcutaneous tissue sharply with the electrocautery.  Dissection was then carried through the breast tissue sharply with the electrocautery towards the palpable mass.  Once I was able to grasp the mass with an Allis clamp I then separated the mass from the rest of the surrounding breast tissue sharply with the electrocautery.  The entire mass was then removed from the patient.  The mass was oriented with the appropriate paint colors.  A specimen radiograph was obtained that showed  the previous clip to be within the specimen.  The specimen was then sent to pathology for further evaluation.  Hemostasis was achieved using the Bovie electrocautery.  The wound was irrigated with saline and infiltrated with more quarter percent Marcaine.  The deep layer of the incision was then closed with layers of interrupted 3-0 Vicryl stitches.  The skin was then closed with interrupted 4-0 Monocryl subcuticular stitches.  Dermabond dressings were applied.  The patient tolerated the procedure well.  At the end of the case all needle sponge and instrument counts were correct.  The patient was then awakened and taken to recovery in stable condition.  PLAN OF CARE: Discharge to home after PACU  PATIENT DISPOSITION:  PACU - hemodynamically stable.   Delay start of Pharmacological VTE agent (>24hrs) due to surgical blood loss or risk of bleeding: not applicable

## 2022-09-04 NOTE — Anesthesia Procedure Notes (Signed)
Procedure Name: LMA Insertion Date/Time: 09/04/2022 8:20 AM  Performed by: Ronnette Hila, CRNAPre-anesthesia Checklist: Patient identified, Emergency Drugs available, Suction available and Patient being monitored Patient Re-evaluated:Patient Re-evaluated prior to induction Oxygen Delivery Method: Circle system utilized Preoxygenation: Pre-oxygenation with 100% oxygen Induction Type: IV induction Ventilation: Mask ventilation without difficulty LMA: LMA inserted LMA Size: 4.0 Number of attempts: 1 Airway Equipment and Method: Bite block Placement Confirmation: positive ETCO2 Tube secured with: Tape Dental Injury: Teeth and Oropharynx as per pre-operative assessment

## 2022-09-07 LAB — SURGICAL PATHOLOGY

## 2022-09-24 IMAGING — DX DG CHEST 2V
2 series · 2 of 2 positions shown · non-contrast
Comparison: 11/12/2020

CLINICAL DATA: Tachycardia

EXAM:
CHEST - 2 VIEW

[chest pa]
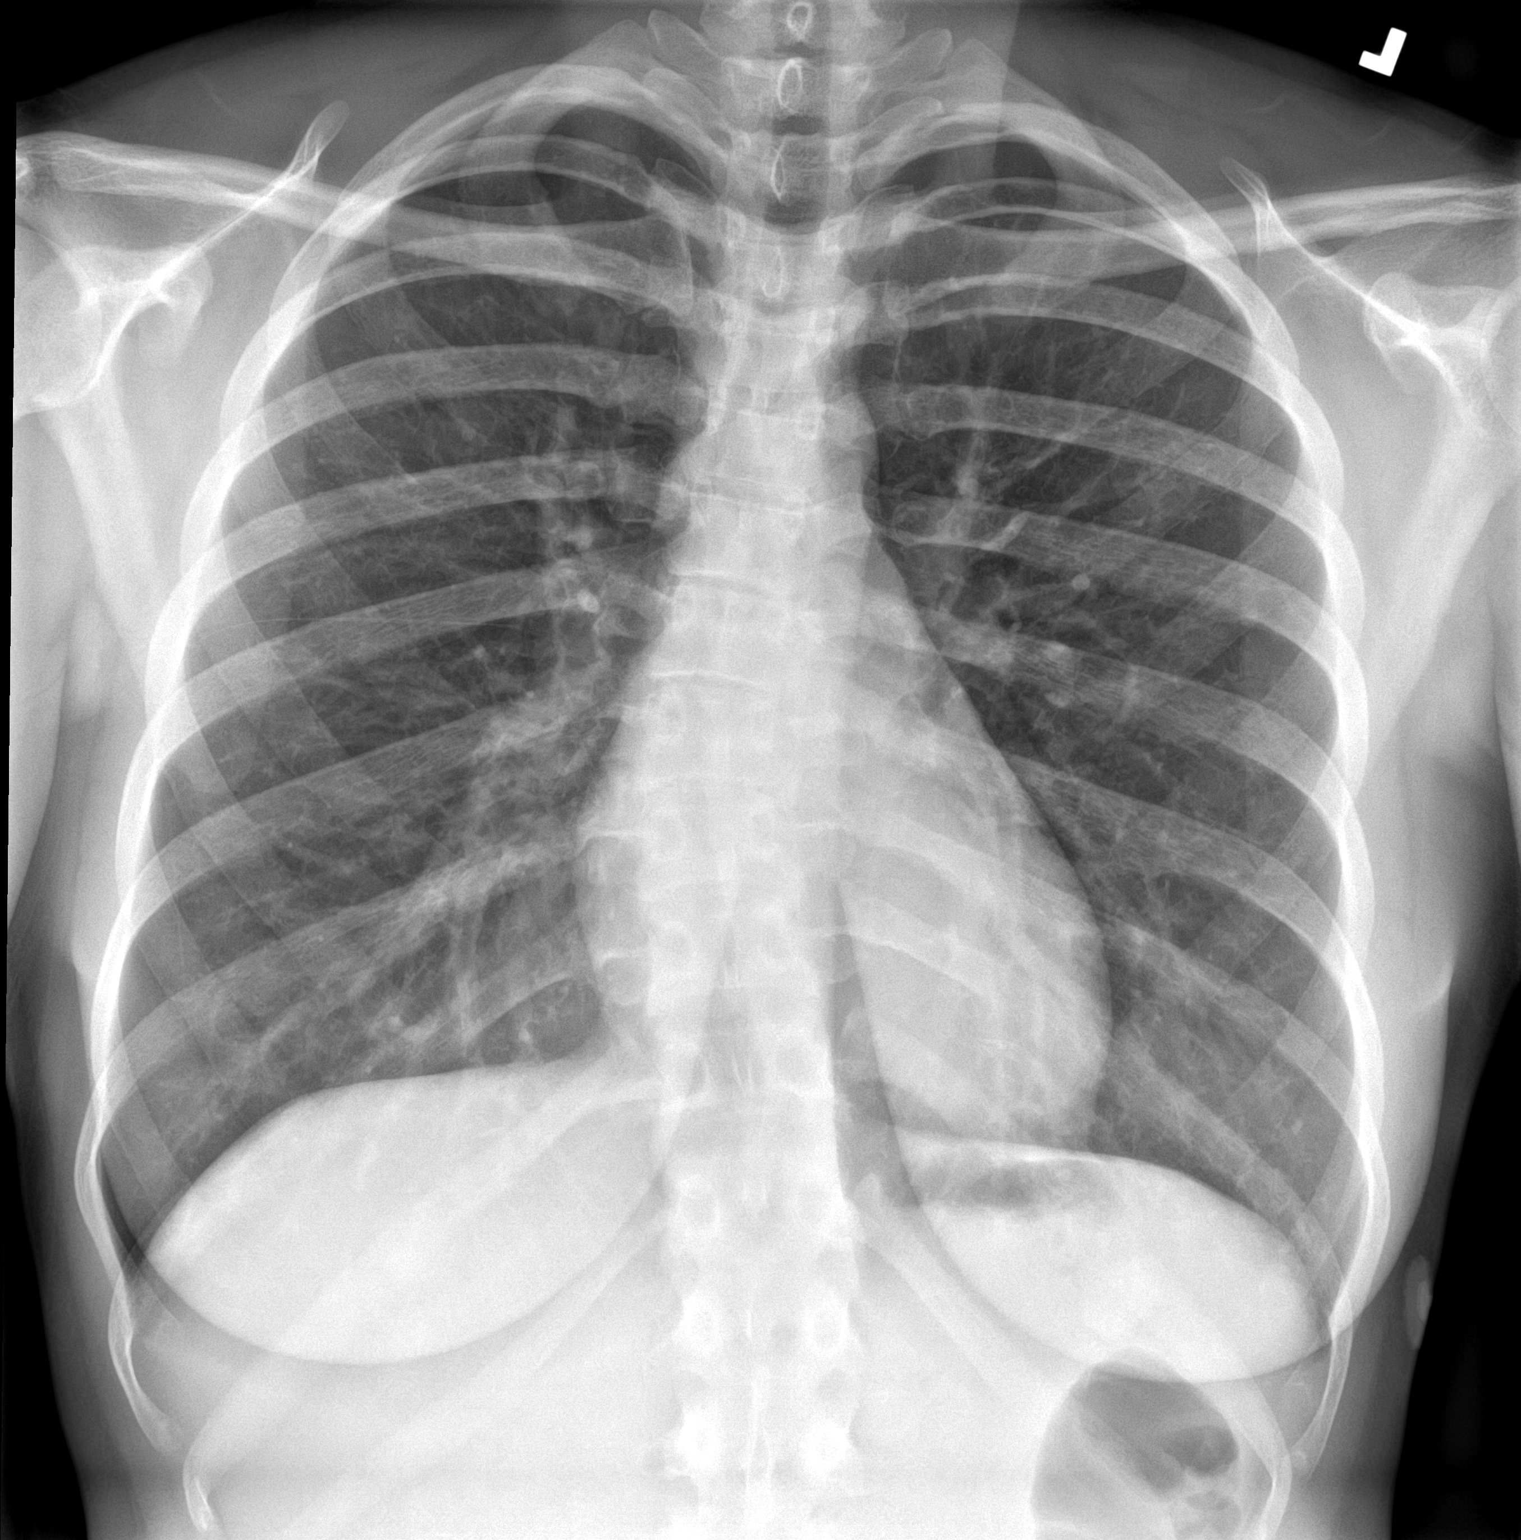

[chest lat]
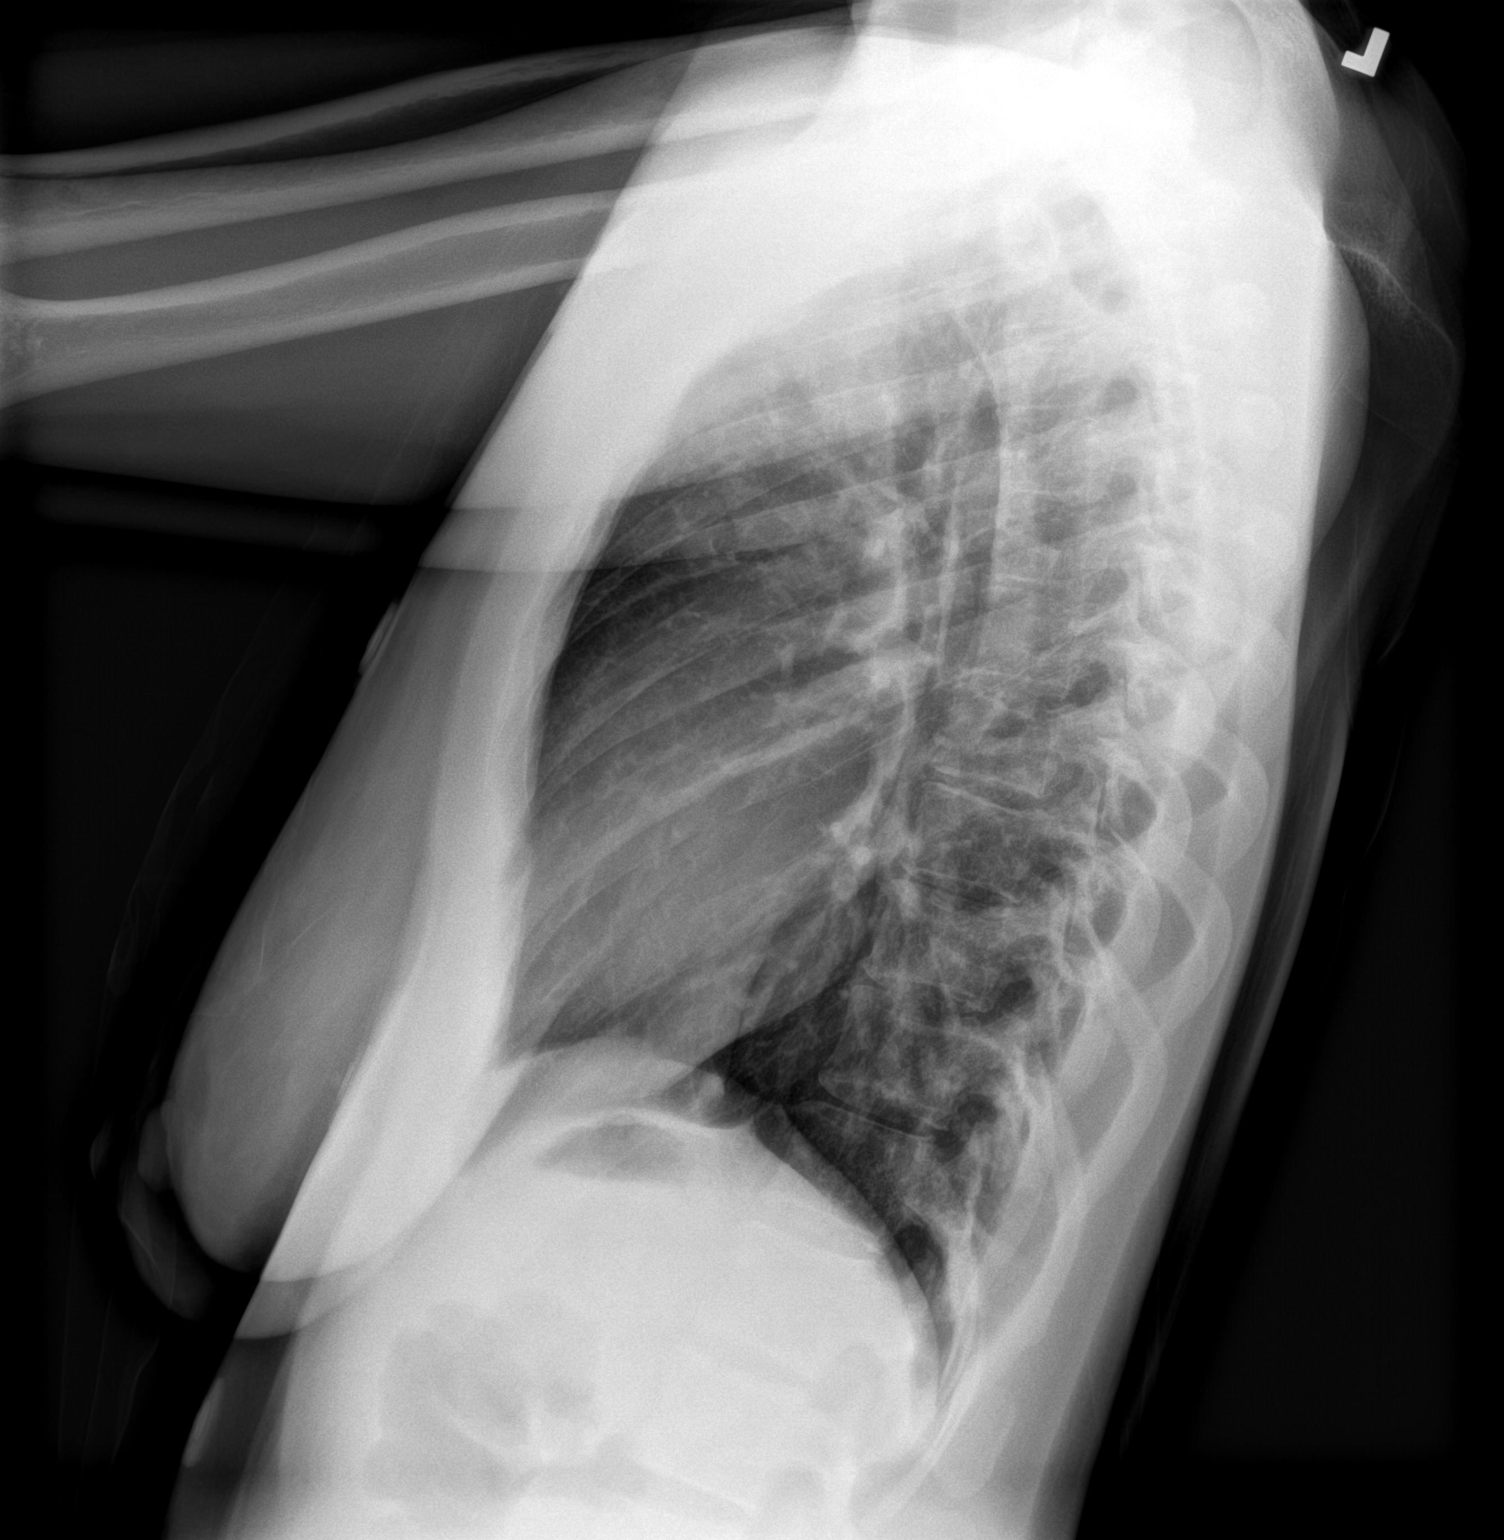

[2 of 2 positions shown; findings below may reference images not displayed]

FINDINGS: Lungs are clear.  No pleural effusion or pneumothorax.

The heart is normal in size.

Visualized osseous structures are within normal limits.
IMPRESSION: Normal chest radiographs.

## 2022-12-11 ENCOUNTER — Encounter (HOSPITAL_BASED_OUTPATIENT_CLINIC_OR_DEPARTMENT_OTHER): Payer: Self-pay

## 2022-12-11 ENCOUNTER — Other Ambulatory Visit: Payer: Self-pay

## 2022-12-11 ENCOUNTER — Emergency Department (HOSPITAL_BASED_OUTPATIENT_CLINIC_OR_DEPARTMENT_OTHER)
Admission: EM | Admit: 2022-12-11 | Discharge: 2022-12-11 | Disposition: A | Discharge status: Home / Self Care | Payer: MEDICAID | Attending: Emergency Medicine | Admitting: Emergency Medicine

## 2022-12-11 ENCOUNTER — Emergency Department (HOSPITAL_BASED_OUTPATIENT_CLINIC_OR_DEPARTMENT_OTHER): Payer: MEDICAID

## 2022-12-11 DIAGNOSIS — K5900 Constipation, unspecified: Secondary | ICD-10-CM | POA: Insufficient documentation

## 2022-12-11 DIAGNOSIS — R1030 Lower abdominal pain, unspecified: Secondary | ICD-10-CM

## 2022-12-11 LAB — COMPREHENSIVE METABOLIC PANEL
ALT: 9 U/L (ref 0–44)
AST: 12 U/L — ABNORMAL LOW (ref 15–41)
Albumin: 4.5 g/dL (ref 3.5–5.0)
Alkaline Phosphatase: 85 U/L (ref 38–126)
Anion gap: 10 (ref 5–15)
BUN: 14 mg/dL (ref 6–20)
CO2: 25 mmol/L (ref 22–32)
Calcium: 9.8 mg/dL (ref 8.9–10.3)
Chloride: 101 mmol/L (ref 98–111)
Creatinine, Ser: 0.85 mg/dL (ref 0.44–1.00)
GFR, Estimated: >60 mL/min (ref >60)
Glucose, Bld: 77 mg/dL (ref 70–99)
Potassium: 3.9 mmol/L (ref 3.5–5.1)
Sodium: 136 mmol/L (ref 135–145)
Total Bilirubin: 0.5 mg/dL (ref 0.3–1.2)
Total Protein: 7 g/dL (ref 6.5–8.1)

## 2022-12-11 LAB — CBC WITH DIFFERENTIAL/PLATELET
Abs Immature Granulocytes: 0.03 10*3/uL (ref 0.00–0.07)
Basophils Absolute: 0 10*3/uL (ref 0.0–0.1)
Basophils Relative: 0 %
Eosinophils Absolute: 0.1 10*3/uL (ref 0.0–0.5)
Eosinophils Relative: 1 %
HCT: 38.1 % (ref 36.0–46.0)
Hemoglobin: 12.7 g/dL (ref 12.0–15.0)
Immature Granulocytes: 0 %
Lymphocytes Relative: 22 %
Lymphs Abs: 2.1 10*3/uL (ref 0.7–4.0)
MCH: 28.7 pg (ref 26.0–34.0)
MCHC: 33.3 g/dL (ref 30.0–36.0)
MCV: 86 fL (ref 80.0–100.0)
Monocytes Absolute: 0.5 10*3/uL (ref 0.1–1.0)
Monocytes Relative: 5 %
Neutro Abs: 7.1 10*3/uL (ref 1.7–7.7)
Neutrophils Relative %: 72 %
Platelets: 454 10*3/uL — ABNORMAL HIGH (ref 150–400)
RBC: 4.43 MIL/uL (ref 3.87–5.11)
RDW: 12.1 % (ref 11.5–15.5)
WBC: 9.7 10*3/uL (ref 4.0–10.5)
nRBC: 0 % (ref 0.0–0.2)

## 2022-12-11 LAB — URINALYSIS, ROUTINE W REFLEX MICROSCOPIC
Bilirubin Urine: NEGATIVE
Glucose, UA: NEGATIVE mg/dL
Hgb urine dipstick: NEGATIVE
Ketones, ur: NEGATIVE mg/dL
Leukocytes,Ua: NEGATIVE
Nitrite: NEGATIVE
Protein, ur: NEGATIVE mg/dL
Specific Gravity, Urine: <1.005 — ABNORMAL LOW (ref 1.005–1.030)
pH: 6 (ref 5.0–8.0)

## 2022-12-11 LAB — LIPASE, BLOOD: Lipase: 34 U/L (ref 11–51)

## 2022-12-11 LAB — PREGNANCY, URINE: Preg Test, Ur: NEGATIVE

## 2022-12-11 MED ORDER — IOHEXOL 300 MG/ML  SOLN
100.0000 mL | Freq: Once | INTRAMUSCULAR | Status: AC | PRN
  Administered 2022-12-11: 75 mL via INTRAVENOUS

## 2022-12-11 MED ORDER — KETOROLAC TROMETHAMINE 15 MG/ML IJ SOLN
15.0000 mg | Freq: Once | INTRAMUSCULAR | Status: AC
  Administered 2022-12-11: 15 mg via INTRAVENOUS
  Filled 2022-12-11: qty 1

## 2022-12-11 NOTE — ED Triage Notes (Signed)
Pt began having lower right and left abd pain an hour ago. Denies dysuria. Does endorse urinary urgency. No vaginal bleeding but pain does feel like cramping with menstrual cycle. LMP was  2 weeks ago

## 2022-12-11 NOTE — ED Provider Notes (Signed)
[facility] AT [facility] Provider Note   CSN: [id number] Arrival date & time: 12/11/22  1842     History  Chief Complaint  Patient presents with   Abdominal Pain    Kathleen Swanson is a 27 y.o. female.  Patient is a 27 year old female with a history of anxiety and migraines status post tubal ligation who is presenting today with sudden onset of lower abdominal/pelvic pain.  She reports she was driving in the car when suddenly started having severe pain.  She feels like it is bilateral there is not one spot that hurts more than another.  It has been constant since it started and not getting any better.  She denies any vaginal discharge, urinary complaints.  No nausea vomiting.  Her last menstrual cycle was 2 weeks ago and normal.  She has not noticed any bleeding since the pain started.  She denies any trauma.  No prior history of similar symptoms.  Last bowel movement was this morning and was normal.  She did try taking Tylenol when the pain initially started but has not helped.  The history is provided by the patient.  Abdominal Pain      Home Medications Prior to Admission medications   Medication Sig Start Date End Date Taking? Authorizing Provider  butalbital-acetaminophen-caffeine (FIORICET) 50-325-40 MG tablet Take by mouth. 03/26/22   [provider]  cetirizine (ZYRTEC ALLERGY) 10 MG tablet Take 1 tablet (10 mg total) by mouth daily. 02/17/20   Roxy Horseman, PA-C  Cyanocobalamin (B-12 PO) Take by mouth.    [provider]  Ferrous Sulfate (IRON PO) Take by mouth.    [provider]  hydrOXYzine (VISTARIL) 50 MG capsule Take 50 mg by mouth at bedtime.    [provider]  ondansetron (ZOFRAN) 4 MG tablet Take 1 tablet (4 mg total) by mouth every 8 (eight) hours as needed for nausea or vomiting. 09/26/19   Devoria Albe, MD  oxyCODONE (ROXICODONE) 5 MG immediate release tablet Take 1 tablet (5 mg total) by mouth  every 6 (six) hours as needed for severe pain. 09/04/22   Griselda Miner, MD  valACYclovir (VALTREX) 500 MG tablet Take 500 mg by mouth daily.    [provider]      Allergies    Metronidazole    Review of Systems   Review of Systems  Gastrointestinal:  Positive for abdominal pain.    Physical Exam Updated Vital Signs BP 128/74 (BP Location: Right Arm)   Pulse 72   Temp 97.8 F (36.6 C)   Resp 18   Ht 5\' 4"  (1.626 m)   Wt 68 kg   LMP 11/23/2022   SpO2 100%   BMI 25.75 kg/m  Physical Exam Vitals and nursing note reviewed.  Constitutional:      General: She is not in acute distress.    Appearance: She is well-developed.  HENT:     Head: Normocephalic and atraumatic.  Eyes:     Pupils: Pupils are equal, round, and reactive to light.  Cardiovascular:     Rate and Rhythm: Normal rate and regular rhythm.     Heart sounds: Normal heart sounds. No murmur heard.    No friction rub.  Pulmonary:     Effort: Pulmonary effort is normal.     Breath sounds: Normal breath sounds. No wheezing or rales.  Abdominal:     General: Bowel sounds are normal. There is no distension.     Palpations: Abdomen  is soft.     Tenderness: There is abdominal tenderness in the right lower quadrant, suprapubic area and left lower quadrant. There is no guarding or rebound.     Comments: Pelvic tenderness with palpation  Musculoskeletal:        General: No tenderness. Normal range of motion.     Comments: No edema  Skin:    General: Skin is warm and dry.     Findings: No rash.  Neurological:     Mental Status: She is alert and oriented to person, place, and time.     Cranial Nerves: No cranial nerve deficit.  Psychiatric:        Behavior: Behavior normal.     ED Results / Procedures / Treatments   Labs (all labs ordered are listed, but only abnormal results are displayed) Labs Reviewed  COMPREHENSIVE METABOLIC PANEL - Abnormal; Notable for the following components:      Result  Value   AST 12 (*)    All other components within normal limits  CBC WITH DIFFERENTIAL/PLATELET - Abnormal; Notable for the following components:   Platelets 454 (*)    All other components within normal limits  URINALYSIS, ROUTINE W REFLEX MICROSCOPIC - Abnormal; Notable for the following components:   Color, Urine COLORLESS (*)    Specific Gravity, Urine <1.005 (*)    All other components within normal limits  LIPASE, BLOOD  PREGNANCY, URINE    EKG None  Radiology CT ABDOMEN PELVIS W CONTRAST  Result Date: 12/11/2022 CLINICAL DATA:  Sudden onset lower abdominal pain with urinary urgency. EXAM: CT ABDOMEN AND PELVIS WITH CONTRAST TECHNIQUE: Multidetector CT imaging of the abdomen and pelvis was performed using the standard protocol following bolus administration of intravenous contrast. RADIATION DOSE REDUCTION: This exam was performed according to the departmental dose-optimization program which includes automated exposure control, adjustment of the mA and/or kV according to patient size and/or use of iterative reconstruction technique. CONTRAST:  75mL OMNIPAQUE IOHEXOL 300 MG/ML  SOLN COMPARISON:  None Available. FINDINGS: Lower chest: No abnormality. Hepatobiliary: The liver is 19 cm length slightly steatotic. There is no mass enhancement. The gallbladder and bile ducts are unremarkable. Pancreas: No abnormality. Spleen: Occasional calcified granulomas. No mass enhancement. No splenomegaly. Adrenals/Urinary Tract: There is no adrenal mass. 6 mm solitary hypodensity of the outer interpolar cortex of the left kidney is too small to characterize. 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 No follow-up imaging is recommended. JACR 2018 Feb; 264-273, Management of the Incidental Renal Mass on CT, [employer] 2021; 814-848, Bosniak Classification of Cystic Renal Masses, Version 2019. Remainder of the bilateral kidneys enhanced normally. There is no urinary stone or obstruction. Unremarkable  bladder. Stomach/Bowel: No dilatation or wall thickening including of the appendix. Scattered colonic diverticula without evidence of acute diverticulitis. Mild fecal stasis ascending and proximal transverse colon. Vascular/Lymphatic: No significant vascular findings are present. No enlarged abdominal or pelvic lymph nodes. Reproductive: 4 cm heterogeneous subserosal fibroid of the dorsal body of uterus. Otherwise unremarkable uterus. The ovaries are follicular but not enlarged. Other: Trace free fluid noted in the pelvic cul-de-sac, probably physiologic given age. There is no free hemorrhage, free air, or focal inflammatory process. 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 No acute or other significant osseous findings. Nonemergent MRI would be helpful if there are localizing symptoms. Additionally, there is a right paracentral L5-S1 disc protrusion which may compromise the  right S1 nerve root. Again, MRI may be helpful if there are localizing symptoms. IMPRESSION: 1. No acute findings in the abdomen or pelvis. 2. Constipation and diverticulosis. 3. Fibroid uterus. 4. Right paracentral L5-S1 disc protrusion which may compromise the right S1 nerve root. Nonemergent MRI may be helpful if there are localizing symptoms. 5. 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 Nonemergent MRI may be helpful if there are localizing symptoms. 6. 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 No follow-up imaging recommended. Electronically Signed   By: Almira Bar M.D.   On: 12/11/2022 22:07    Procedures Procedures    Medications Ordered in ED Medications  ketorolac (TORADOL) 15 MG/ML injection 15 mg (15 mg Intravenous Given 12/11/22 2110)  iohexol (OMNIPAQUE) 300 MG/ML solution 100 mL (75 mLs Intravenous  Contrast Given 12/11/22 2139)    ED Course/ Medical Decision Making/ A&P                                 Medical Decision Making Amount and/or Complexity of Data Reviewed Labs: ordered. Decision-making details documented in ED Course. Radiology: ordered and independent interpretation performed. Decision-making details documented in ED Course.  Risk Prescription drug management.   Pt  presenting today with a complaint that caries a high risk for morbidity and mortality.  Presenting today with an abrupt onset of abdominal pain.  Concern for ovarian torsion, ruptured ovarian cyst, kidney stone.  Low suspicion for appendicitis or bowel perforation.  I independently interpreted patient's labs and UA, CBC, CMP and lipase all within normal limits.  Patient given pain medication CT for further evaluation pending.  10:44 PM I have independently visualized and interpreted pt's images today.  CT without evidence of hydronephrosis, bowel perforation, masses.  Radiology reports no evidence of significant free fluid and normal ovaries.  They did report some back etiology and hip pathology that might need an MRI in the future but did not feel that that is the source of her pain today.  They also report patient had constipation which may be that was the cause of patient's discomfort.  After Toradol patient reports her pain is resolved.  She has no complaints at this time.  No indication for further imaging or testing at this time but she was given return precautions.          Final Clinical Impression(s) / ED Diagnoses Final diagnoses:  Lower abdominal pain  Constipation, unspecified constipation type    Rx / DC Orders ED Discharge Orders     None         Gwyneth Sprout, MD 12/11/22 2244

## 2022-12-11 NOTE — Discharge Instructions (Addendum)
Everything on your blood work and CAT scan today looked okay as there are no blockages in your bowel, kidney stones or problems with your ovaries.  It did show that you may be a bit constipated so you may want to try a stool softener, prune juice or fruits.  If you start having return of pain, vomiting, fevers, inability to urinate return to the emergency room.

## 2023-04-07 ENCOUNTER — Telehealth: Payer: Self-pay | Admitting: Emergency Medicine

## 2023-04-07 ENCOUNTER — Ambulatory Visit (INDEPENDENT_AMBULATORY_CARE_PROVIDER_SITE_OTHER): Payer: MEDICAID | Admitting: Obstetrics and Gynecology

## 2023-04-07 ENCOUNTER — Encounter: Payer: Self-pay | Admitting: Obstetrics and Gynecology

## 2023-04-07 VITALS — BP 116/75 | HR 77 | Ht 64.0 in | Wt 162.0 lb

## 2023-04-07 DIAGNOSIS — R102 Pelvic and perineal pain: Secondary | ICD-10-CM

## 2023-04-07 DIAGNOSIS — Z7689 Persons encountering health services in other specified circumstances: Secondary | ICD-10-CM

## 2023-04-07 NOTE — Telephone Encounter (Signed)
Attempted TC to patient- Patient should return to office to repeat self-swab. Self swab unable to be used and discarded by lab. LVM

## 2023-04-07 NOTE — Patient Instructions (Signed)
Birth Control Options Birth control is also called contraception. Birth control prevents pregnancy. There are many types of birth control. Work with your health care provider to find the best option for you. Birth control that uses hormones These types of birth control have hormones in them to prevent pregnancy. Birth control implant This is a small tube that is put into the skin of your arm. The tube can stay in for up to 3 years. Birth control shot These are shots you get every 3 months. Birth control pills This is a pill you take every day. You need to take it at the same time each day. Birth control patch This is a patch that you put on your skin. You change it 1 time each week for 3 weeks. After that, you take the patch off for 1 week. Vaginal ring  This is a soft plastic ring that you put in your vagina. The ring is left in for 3 weeks. Then, you take it out for 1 week. Then, you put a new ring in. Barrier methods  Female condom This is a thin covering that you put on the penis before sex. The condom is thrown away after sex. Female condom This is a soft, loose covering that you put in the vagina before sex. The condom is thrown away after sex. Diaphragm A diaphragm is a soft barrier that is shaped like a bowl. It must be made to fit your body. You put it in the vagina before sex with a chemical that kills sperm called spermicide. A diaphragm should be left in the vagina for 6-8 hours after sex and taken out within 24 hours. You need to replace a diaphragm: Every 1-2 years. After giving birth. After gaining more than 15 lb (6.8 kg). If you have surgery on your pelvis. Cervical cap This is a small, soft cup that fits over the cervix. The cervix is the lowest part of the uterus. It's put in the vagina before sex, along with spermicide. The cap must be made for you. The cap should be left in for 6-8 hours after sex. It is taken out within 48 hours. A cervical cap must be prescribed  and fit to your body by a provider. It should be replaced every 2 years. Sponge This is a small sponge that is put into the vagina before sex. It must be left in for at least 6 hours after sex. It must be taken out within 30 hours and thrown away. Spermicides These are chemicals that kill or stop sperm from getting into the uterus. They may be a pill, cream, jelly, or foam that you put into your vagina. They should be used at least 10-15 minutes before sex. Intrauterine device An intrauterine device (IUD) is a device that's put in the uterus by a provider. There are two types: Hormone IUD. This kind can stay in for 3-5 years. Copper IUD. This kind can stay in for 10 years. Permanent birth control Female tubal ligation This is surgery to block the fallopian tubes. Hysteroscopic sterilization This is a procedure to put an insert into each fallopian tube. This method takes 3 months to work. Other forms of birth control must be used for 3 months. Female sterilization This is a surgery, called a vasectomy, to tie off the tubes that carry sperm in men. This method takes 3 months to work. Other forms of birth control must be used for 3 months. Natural planning methods This means not having sex on  the days the female partner could get pregnant. Here are some types of natural planning birth control: Using a calendar: To keep track of the length of each menstrual cycle. To find out what days pregnancy can happen. To plan to not have sex on days when pregnancy can happen. Watching for signs of ovulation and not having sex during this time. The female partner can check for ovulation by keeping track of their temperature each day. They can also look for changes in the mucus that comes from the cervix. Where to find more information Centers for Disease Control and Prevention: TonerProm[employer].no This information is not intended to replace advice given to you by your health care provider. Make sure you discuss any  questions you have with your health care provider. Document Revised: 06/02/2022 Document Reviewed: 06/02/2022 Elsevier Patient Education  2024 Elsevier Inc.    Endometrial Ablation Endometrial ablation is a procedure that destroys the thin inner layer of the lining of the uterus (endometrium). This procedure may be done: To stop heavy menstrual periods. To stop bleeding that is causing anemia. To control irregular bleeding. To treat bleeding caused by small tumors (fibroids) in the endometrium. This procedure is often done as an alternative to major surgery, such as removal of the uterus and cervix (hysterectomy). As a result of this procedure: You may not be able to have children. However, if you have not yet gone through menopause: You may still have a small chance of getting pregnant. You will need to use a reliable method of birth control after the procedure to prevent pregnancy. You may stop having a menstrual period, or you may have only a small amount of bleeding during your period. Menstruation may return several years after the procedure. Tell a health care provider about: Any allergies you have. All medicines you are taking, including vitamins, herbs, eye drops, creams, and over-the-counter medicines. Any problems you or family members have had with the use of anesthetic medicines. Any blood disorders you have. Any surgeries you have had. Any medical conditions you have. Whether you are pregnant or may be pregnant. What are the risks? Generally, this is a safe procedure. However, problems may occur, including: A hole (perforation) in the uterus or bowel. Infection in the uterus, bladder, or vagina. Bleeding. Allergic reaction to medicines. Damage to nearby structures or organs. An air bubble in the lung (air embolus). Problems with pregnancy. Failure of the procedure. Decreased ability to diagnose cancer in the endometrium. Scar tissue forms after the procedure, making  it more difficult to get a sample of the uterine lining. What happens before the procedure? Medicines Ask your health care provider about: Changing or stopping your regular medicines. This is especially important if you take diabetes medicines or blood thinners. Taking medicines such as aspirin and ibuprofen. These medicines can thin your blood. Do not take these medicines before your procedure if your doctor tells you not to take them. Taking over-the-counter medicines, vitamins, herbs, and supplements. Tests You will have tests of your endometrium to make sure there are no precancerous cells or cancer cells present. You may have an ultrasound of the uterus. General instructions Do not use any products that contain nicotine or tobacco for at least 4 weeks before the procedure. These include cigarettes, chewing tobacco, and vaping devices, such as e-cigarettes. If you need help quitting, ask your health care provider. You may be given medicines to thin the endometrium. Ask your health care provider what steps will be taken to help  prevent infection. These steps may include: Removing hair at the surgery site. Washing skin with a germ-killing soap. Taking antibiotic medicine. Plan to have a responsible adult take you home from the hospital or clinic. Plan to have a responsible adult care for you for the time you are told after you leave the hospital or clinic. This is important. What happens during the procedure?  You will lie on an exam table with your feet and legs supported as in a pelvic exam. An IV will be inserted into one of your veins. You will be given a medicine to help you relax (sedative). A surgical tool with a light and camera (resectoscope) will be inserted into your vagina and moved into your uterus. This allows your surgeon to see inside your uterus. Endometrial tissue will be destroyed and removed, using one of the following methods: Radiofrequency. This uses an electrical  current to destroy the endometrium. Cryotherapy. This uses extreme cold to freeze the endometrium. Heated fluid. This uses a heated salt and water (saline) solution to destroy the endometrium. Microwave. This uses high-energy microwaves to heat up the endometrium and destroy it. Thermal balloon. This involves inserting a catheter with a balloon tip into the uterus. The balloon tip is filled with heated fluid to destroy the endometrium. The procedure may vary among health care providers and hospitals. What happens after the procedure? Your blood pressure, heart rate, breathing rate, and blood oxygen level will be monitored until you leave the hospital or clinic. You may have vaginal bleeding for 4-6 weeks after the procedure. You may also have: Cramps. A thin, watery vaginal discharge that is light pink or brown. A need to urinate more than usual. Nausea. If you were given a sedative during the procedure, it can affect you for several hours. Do not drive or operate machinery until your health care provider says that it is safe. Do not have sex or insert anything into your vagina until your health care provider says it is safe. Summary Endometrial ablation is done to treat many causes of heavy menstrual bleeding. The procedure destroys the thin inner layer of the lining of the uterus (endometrium). This procedure is often done as an alternative to major surgery, such as removal of the uterus and cervix (hysterectomy). Plan to have a responsible adult take you home from the hospital or clinic. This information is not intended to replace advice given to you by your health care provider. Make sure you discuss any questions you have with your health care provider. Document Revised: 10/26/2019 Document Reviewed: 10/26/2019 Elsevier Patient Education  2024 [employer].

## 2023-04-07 NOTE — Progress Notes (Signed)
27 yo P3 with LMP 04/05/23 and BMI 27 presenting as an ED follow up to establish care and discuss pelvic pain and heavy period. Patient reports a period lasting 4-7 days, heavy in flow. She is sexually active using BTL for contraception. She reports some dysmenorrhea and was told she had a fibroid during an August CT scan. Patient is without any other complaints. She is interested in STI testing as well  Past Medical History:  Diagnosis Date   Anxiety    Migraine    Past Surgical History:  Procedure Laterality Date   ADENOIDECTOMY     BREAST LUMPECTOMY Right 09/04/2022   Procedure: RIGHT BREAST LUMPECTOMY;  Surgeon: Griselda Miner, MD;  Location: [facility];  Service: General;  Laterality: Right;   CESAREAN SECTION     TUBAL LIGATION     WISDOM TOOTH EXTRACTION     Family History  Problem Relation Age of Onset   Hypertension Mother    Hyperlipidemia Mother    Fibromyalgia Mother    Multiple sclerosis Sister    Breast cancer Maternal Grandmother        under 50 at onset   Social History   Tobacco Use   Smoking status: Never    Passive exposure: Never   Smokeless tobacco: Never  Vaping Use   Vaping status: Never Used  Substance Use Topics   Alcohol use: No   Drug use: No   ROS See pertinent in HPI. All other systems reviewed and non contributory Blood pressure 116/75, pulse 77, height 5\' 4"  (1.626 m), weight 162 lb (73.5 kg), last menstrual period 04/05/2023. GENERAL: Well-developed, well-nourished female in no acute distress.  ABDOMEN: Soft, nontender, nondistended. No organomegaly. PELVIC: deferred EXTREMITIES: No cyanosis, clubbing, or edema, 2+ distal pulses.  A/P 27 yo P3 with fibroid uterus and menorrhagia with regular cycles - STI screening per patient request - Pelvic ultrasound ordered - Discussed medical management with hormonal contraception pending ultrasound - Patient will be contacted with results and do research on management options

## 2023-04-07 NOTE — Progress Notes (Signed)
27 y.o. New GYN presents for Cyst on Ovary.  Last PAP 06/04/22.

## 2023-04-08 LAB — HEPATITIS C ANTIBODY: Hep C Virus Ab: NONREACTIVE

## 2023-04-08 LAB — TSH: TSH: 1.16 u[IU]/mL (ref 0.450–4.500)

## 2023-04-08 LAB — COMPREHENSIVE METABOLIC PANEL
ALT: 8 [IU]/L (ref 0–32)
AST: 10 [IU]/L (ref 0–40)
Albumin: 4.4 g/dL (ref 4.0–5.0)
Alkaline Phosphatase: 108 [IU]/L (ref 44–121)
BUN/Creatinine Ratio: 17 (ref 9–23)
BUN: 14 mg/dL (ref 6–20)
Bilirubin Total: 0.2 mg/dL (ref 0.0–1.2)
CO2: 22 mmol/L (ref 20–29)
Calcium: 9.8 mg/dL (ref 8.7–10.2)
Chloride: 102 mmol/L (ref 96–106)
Creatinine, Ser: 0.83 mg/dL (ref 0.57–1.00)
Globulin, Total: 2.3 g/dL (ref 1.5–4.5)
Glucose: 85 mg/dL (ref 70–99)
Potassium: 5 mmol/L (ref 3.5–5.2)
Sodium: 140 mmol/L (ref 134–144)
Total Protein: 6.7 g/dL (ref 6.0–8.5)
eGFR: 98 mL/min/{1.73_m2} (ref >59)

## 2023-04-08 LAB — CBC
Hematocrit: 38.9 % (ref 34.0–46.6)
Hemoglobin: 12.5 g/dL (ref 11.1–15.9)
MCH: 27.7 pg (ref 26.6–33.0)
MCHC: 32.1 g/dL (ref 31.5–35.7)
MCV: 86 fL (ref 79–97)
Platelets: 444 10*3/uL (ref 150–450)
RBC: 4.51 x10E6/uL (ref 3.77–5.28)
RDW: 11.9 % (ref 11.7–15.4)
WBC: 5.7 10*3/uL (ref 3.4–10.8)

## 2023-04-08 LAB — HEPATITIS B SURFACE ANTIGEN: Hepatitis B Surface Ag: NEGATIVE

## 2023-04-08 LAB — HIV ANTIBODY (ROUTINE TESTING W REFLEX): HIV Screen 4th Generation wRfx: NONREACTIVE

## 2023-04-08 LAB — RPR: RPR Ser Ql: NONREACTIVE

## 2023-04-09 ENCOUNTER — Ambulatory Visit (HOSPITAL_BASED_OUTPATIENT_CLINIC_OR_DEPARTMENT_OTHER)
Admission: RE | Admit: 2023-04-09 | Discharge: 2023-04-09 | Disposition: A | Discharge status: Home / Self Care | Payer: MEDICAID | Source: Ambulatory Visit | Attending: Obstetrics and Gynecology | Admitting: Obstetrics and Gynecology

## 2023-04-09 DIAGNOSIS — R102 Pelvic and perineal pain: Secondary | ICD-10-CM | POA: Insufficient documentation

## 2023-04-20 ENCOUNTER — Encounter: Payer: Self-pay | Admitting: Obstetrics and Gynecology

## 2023-08-03 ENCOUNTER — Encounter (HOSPITAL_BASED_OUTPATIENT_CLINIC_OR_DEPARTMENT_OTHER): Payer: Self-pay | Admitting: Emergency Medicine

## 2023-08-03 DIAGNOSIS — R519 Headache, unspecified: Secondary | ICD-10-CM | POA: Insufficient documentation

## 2023-08-03 NOTE — ED Triage Notes (Signed)
 Headache, back of head Started yesterday No relief with tylenol ibuprofen or rx migraine meds  Denies vision issues no n/v  Different from her normal migraine

## 2023-08-04 ENCOUNTER — Emergency Department (HOSPITAL_BASED_OUTPATIENT_CLINIC_OR_DEPARTMENT_OTHER)
Admission: EM | Admit: 2023-08-04 | Discharge: 2023-08-04 | Disposition: A | Discharge status: Home / Self Care | Payer: MEDICAID | Attending: Emergency Medicine | Admitting: Emergency Medicine

## 2023-08-04 ENCOUNTER — Emergency Department (HOSPITAL_BASED_OUTPATIENT_CLINIC_OR_DEPARTMENT_OTHER): Payer: MEDICAID

## 2023-08-04 DIAGNOSIS — R519 Headache, unspecified: Secondary | ICD-10-CM

## 2023-08-04 LAB — CBC WITH DIFFERENTIAL/PLATELET
Abs Immature Granulocytes: 0.01 10*3/uL (ref 0.00–0.07)
Basophils Absolute: 0 10*3/uL (ref 0.0–0.1)
Basophils Relative: 0 %
Eosinophils Absolute: 0.1 10*3/uL (ref 0.0–0.5)
Eosinophils Relative: 2 %
HCT: 37.3 % (ref 36.0–46.0)
Hemoglobin: 12.2 g/dL (ref 12.0–15.0)
Immature Granulocytes: 0 %
Lymphocytes Relative: 34 %
Lymphs Abs: 2.7 10*3/uL (ref 0.7–4.0)
MCH: 28.5 pg (ref 26.0–34.0)
MCHC: 32.7 g/dL (ref 30.0–36.0)
MCV: 87.1 fL (ref 80.0–100.0)
Monocytes Absolute: 0.5 10*3/uL (ref 0.1–1.0)
Monocytes Relative: 6 %
Neutro Abs: 4.6 10*3/uL (ref 1.7–7.7)
Neutrophils Relative %: 58 %
Platelets: 406 10*3/uL — ABNORMAL HIGH (ref 150–400)
RBC: 4.28 MIL/uL (ref 3.87–5.11)
RDW: 13 % (ref 11.5–15.5)
WBC: 7.9 10*3/uL (ref 4.0–10.5)
nRBC: 0 % (ref 0.0–0.2)

## 2023-08-04 LAB — BASIC METABOLIC PANEL WITH GFR
Anion gap: 7 (ref 5–15)
BUN: 14 mg/dL (ref 6–20)
CO2: 28 mmol/L (ref 22–32)
Calcium: 9.6 mg/dL (ref 8.9–10.3)
Chloride: 103 mmol/L (ref 98–111)
Creatinine, Ser: 0.78 mg/dL (ref 0.44–1.00)
GFR, Estimated: >60 mL/min (ref >60)
Glucose, Bld: 97 mg/dL (ref 70–99)
Potassium: 4.2 mmol/L (ref 3.5–5.1)
Sodium: 138 mmol/L (ref 135–145)

## 2023-08-04 LAB — HCG, SERUM, QUALITATIVE: Preg, Serum: NEGATIVE

## 2023-08-04 MED ORDER — DEXAMETHASONE SODIUM PHOSPHATE 10 MG/ML IJ SOLN
10.0000 mg | Freq: Once | INTRAMUSCULAR | Status: AC
  Administered 2023-08-04: 10 mg via INTRAVENOUS
  Filled 2023-08-04: qty 1

## 2023-08-04 MED ORDER — METOCLOPRAMIDE HCL 5 MG/ML IJ SOLN: 10.0000 mg | Freq: Once | INTRAMUSCULAR | Status: DC

## 2023-08-04 MED ORDER — DIPHENHYDRAMINE HCL 50 MG/ML IJ SOLN: 25.0000 mg | Freq: Once | INTRAMUSCULAR | Status: DC

## 2023-08-04 MED ORDER — SODIUM CHLORIDE 0.9 % IV BOLUS
1000.0000 mL | Freq: Once | INTRAVENOUS | Status: AC
  Administered 2023-08-04: 1000 mL via INTRAVENOUS

## 2023-08-04 MED ORDER — KETOROLAC TROMETHAMINE 30 MG/ML IJ SOLN
30.0000 mg | Freq: Once | INTRAMUSCULAR | Status: AC
  Administered 2023-08-04: 30 mg via INTRAVENOUS
  Filled 2023-08-04: qty 1

## 2023-08-04 NOTE — Discharge Instructions (Signed)
 Take ibuprofen 600 mg rotated with Tylenol 1000 mg every 4 hours as needed for pain.  Follow-up with primary doctor if symptoms do not improve.

## 2023-08-04 NOTE — ED Provider Notes (Signed)
 [place] EMERGENCY DEPARTMENT AT [facility] Provider Note   CSN: [id number] Arrival date & time: 08/03/23  2113     History  No chief complaint on file.   Kathleen Swanson is a 28 y.o. female.  Patient is a 28 year old female with history of migraine headaches.  Patient presenting today with complaints of headache.  She describes a pain to the back of her head with no associated visual disturbances, weakness, numbness, or fevers.  She has tried taking Tylenol and ibuprofen as well as her prescription migraine medication, but with little relief.  No stiff neck.       Home Medications Prior to Admission medications   Medication Sig Start Date End Date Taking? Authorizing Provider  butalbital-acetaminophen-caffeine (FIORICET) 50-325-40 MG tablet Take by mouth. 03/26/22   [provider]  cetirizine (ZYRTEC ALLERGY) 10 MG tablet Take 1 tablet (10 mg total) by mouth daily. 02/17/20   Roxy Horseman, PA-C  Cyanocobalamin (B-12 PO) Take by mouth.    [provider]  Ferrous Sulfate (IRON PO) Take by mouth.    [provider]  hydrOXYzine (VISTARIL) 50 MG capsule Take 50 mg by mouth at bedtime.    [provider]  ondansetron (ZOFRAN) 4 MG tablet Take 1 tablet (4 mg total) by mouth every 8 (eight) hours as needed for nausea or vomiting. Patient not taking: Reported on 04/07/2023 09/26/19   Devoria Albe, MD  oxyCODONE (ROXICODONE) 5 MG immediate release tablet Take 1 tablet (5 mg total) by mouth every 6 (six) hours as needed for severe pain. 09/04/22   Griselda Miner, MD  valACYclovir (VALTREX) 500 MG tablet Take 500 mg by mouth daily.    [provider]      Allergies    Metronidazole    Review of Systems   Review of Systems  All other systems reviewed and are negative.   Physical Exam Updated Vital Signs BP (!) 125/99   Pulse 94   Temp 98.2 F (36.8 C) (Oral)   Resp 17   LMP 07/31/2023   SpO2 100%  Physical Exam Vitals  and nursing note reviewed.  Constitutional:      General: She is not in acute distress.    Appearance: She is well-developed. She is not diaphoretic.  HENT:     Head: Normocephalic and atraumatic.  Eyes:     Extraocular Movements: Extraocular movements intact.     Pupils: Pupils are equal, round, and reactive to light.  Cardiovascular:     Rate and Rhythm: Normal rate and regular rhythm.     Heart sounds: No murmur heard.    No friction rub. No gallop.  Pulmonary:     Effort: Pulmonary effort is normal. No respiratory distress.     Breath sounds: Normal breath sounds. No wheezing.  Abdominal:     General: Bowel sounds are normal. There is no distension.     Palpations: Abdomen is soft.     Tenderness: There is no abdominal tenderness.  Musculoskeletal:        General: Normal range of motion.     Cervical back: Normal range of motion and neck supple.  Skin:    General: Skin is warm and dry.  Neurological:     General: No focal deficit present.     Mental Status: She is alert and oriented to person, place, and time. Mental status is at baseline.     Cranial Nerves: No cranial nerve deficit.     Motor: No  weakness.     ED Results / Procedures / Treatments   Labs (all labs ordered are listed, but only abnormal results are displayed) Labs Reviewed - No data to display  EKG None  Radiology No results found.  Procedures Procedures    Medications Ordered in ED Medications  sodium chloride 0.9 % bolus 1,000 mL (has no administration in time range)  ketorolac (TORADOL) 30 MG/ML injection 30 mg (has no administration in time range)  metoCLOPramide (REGLAN) injection 10 mg (has no administration in time range)  dexamethasone (DECADRON) injection 10 mg (has no administration in time range)  diphenhydrAMINE (BENADRYL) injection 25 mg (has no administration in time range)    ED Course/ Medical Decision Making/ A&P  Patient is a 28 year old female presenting with headache  as described in the HPI.  She arrives with stable vital signs and is neurologically intact.  There is no nuchal rigidity.  Laboratory studies obtained including CBC, basic metabolic panel, and serum pregnancy test.  All studies unremarkable.  CT scan of the head showing no acute process.  Patient received IV fluids along with a migraine cocktail and seems to be feeling better.  Nothing emergent has shown up in the workup and I feel as though she can safely be discharged.  Final Clinical Impression(s) / ED Diagnoses Final diagnoses:  None    Rx / DC Orders ED Discharge Orders     None         Orvilla Blander, MD 08/04/23 [phone]

## 2023-08-06 DIAGNOSIS — Z87898 Personal history of other specified conditions: Secondary | ICD-10-CM | POA: Insufficient documentation

## 2023-08-06 DIAGNOSIS — G43909 Migraine, unspecified, not intractable, without status migrainosus: Secondary | ICD-10-CM | POA: Insufficient documentation

## 2023-11-18 ENCOUNTER — Emergency Department (HOSPITAL_BASED_OUTPATIENT_CLINIC_OR_DEPARTMENT_OTHER)
Admission: EM | Admit: 2023-11-18 | Discharge: 2023-11-18 | Disposition: A | Discharge status: Home / Self Care | Payer: MEDICAID | Attending: Emergency Medicine | Admitting: Emergency Medicine

## 2023-11-18 ENCOUNTER — Encounter (HOSPITAL_BASED_OUTPATIENT_CLINIC_OR_DEPARTMENT_OTHER): Payer: Self-pay

## 2023-11-18 DIAGNOSIS — R519 Headache, unspecified: Secondary | ICD-10-CM | POA: Diagnosis not present

## 2023-11-18 DIAGNOSIS — G43909 Migraine, unspecified, not intractable, without status migrainosus: Secondary | ICD-10-CM | POA: Diagnosis present

## 2023-11-18 LAB — BASIC METABOLIC PANEL WITH GFR
Anion gap: 14 (ref 5–15)
BUN: 15 mg/dL (ref 6–20)
CO2: 23 mmol/L (ref 22–32)
Calcium: 9.8 mg/dL (ref 8.9–10.3)
Chloride: 102 mmol/L (ref 98–111)
Creatinine, Ser: 0.85 mg/dL (ref 0.44–1.00)
GFR, Estimated: >60 mL/min (ref >60)
Glucose, Bld: 83 mg/dL (ref 70–99)
Potassium: 4.2 mmol/L (ref 3.5–5.1)
Sodium: 139 mmol/L (ref 135–145)

## 2023-11-18 LAB — CBC
HCT: 40.7 % (ref 36.0–46.0)
Hemoglobin: 13.4 g/dL (ref 12.0–15.0)
MCH: 28.4 pg (ref 26.0–34.0)
MCHC: 32.9 g/dL (ref 30.0–36.0)
MCV: 86.2 fL (ref 80.0–100.0)
Platelets: 446 K/uL — ABNORMAL HIGH (ref 150–400)
RBC: 4.72 MIL/uL (ref 3.87–5.11)
RDW: 12.5 % (ref 11.5–15.5)
WBC: 5.1 K/uL (ref 4.0–10.5)
nRBC: 0 % (ref 0.0–0.2)

## 2023-11-18 LAB — HCG, SERUM, QUALITATIVE: Preg, Serum: NEGATIVE

## 2023-11-18 MED ORDER — KETOROLAC TROMETHAMINE 15 MG/ML IJ SOLN
15.0000 mg | Freq: Once | INTRAMUSCULAR | Status: AC
  Administered 2023-11-18: 15 mg via INTRAVENOUS
  Filled 2023-11-18: qty 1

## 2023-11-18 MED ORDER — DEXAMETHASONE SODIUM PHOSPHATE 10 MG/ML IJ SOLN
10.0000 mg | Freq: Once | INTRAMUSCULAR | Status: AC
Start: 2023-11-18 — End: 2023-11-18
  Administered 2023-11-18: 10 mg via INTRAVENOUS
  Filled 2023-11-18: qty 1

## 2023-11-18 MED ORDER — SODIUM CHLORIDE 0.9 % IV BOLUS
1000.0000 mL | Freq: Once | INTRAVENOUS | Status: AC
  Administered 2023-11-18: 1000 mL via INTRAVENOUS

## 2023-11-18 MED ORDER — PROCHLORPERAZINE EDISYLATE 10 MG/2ML IJ SOLN
10.0000 mg | Freq: Once | INTRAMUSCULAR | Status: AC
  Administered 2023-11-18: 10 mg via INTRAVENOUS
  Filled 2023-11-18: qty 2

## 2023-11-18 MED ORDER — DIPHENHYDRAMINE HCL 50 MG/ML IJ SOLN
12.5000 mg | Freq: Once | INTRAMUSCULAR | Status: AC
  Administered 2023-11-18: 12.5 mg via INTRAVENOUS
  Filled 2023-11-18: qty 1

## 2023-11-18 NOTE — Discharge Instructions (Signed)
 As we discussed, your workup in the ER today was reassuring for acute findings.  Laboratory evaluation did not reveal any emergent concerns.  Given you are feeling better after medicines given to you today, no further evaluation is indicated.  I recommend that you follow-up with your primary care provider for management of your migraines.  Return if development of any new or worsening symptoms

## 2023-11-18 NOTE — ED Provider Notes (Signed)
 [place] EMERGENCY DEPARTMENT AT [facility] Provider Note   CSN: [id number] Arrival date & time: 11/18/23  1218     Patient presents with: Migraine   Kathleen Swanson is a 28 y.o. female.   Patient with history of migraines presents today with complaints of migraine. She reports that this began initially on Monday, came on gradually and feels like previous migraines. Reports she took her prescribed Fioricet as well as tylenol  and ibuprofen  without improvement. Denies any vision changes, does endorse some nausea without vomiting.   The history is provided by the patient. No language interpreter was used.  Migraine Associated symptoms include headaches.       Prior to Admission medications   Medication Sig Start Date End Date Taking? Authorizing Provider  butalbital-acetaminophen -caffeine (FIORICET) 50-325-40 MG tablet Take by mouth. 03/26/22   [provider]  cetirizine  (ZYRTEC  ALLERGY) 10 MG tablet Take 1 tablet (10 mg total) by mouth daily. 02/17/20   [name], PA-C  Cyanocobalamin (B-12 PO) Take by mouth.    [provider]  Ferrous Sulfate (IRON PO) Take by mouth.    [provider]  hydrOXYzine  (VISTARIL ) 50 MG capsule Take 50 mg by mouth at bedtime.    [provider]  ondansetron  (ZOFRAN ) 4 MG tablet Take 1 tablet (4 mg total) by mouth every 8 (eight) hours as needed for nausea or vomiting. Patient not taking: Reported on 04/07/2023 09/26/19   Knapp, Iva, MD  oxyCODONE  (ROXICODONE ) 5 MG immediate release tablet Take 1 tablet (5 mg total) by mouth every 6 (six) hours as needed for severe pain. 09/04/22   Curvin Deward MOULD, MD  valACYclovir (VALTREX) 500 MG tablet Take 500 mg by mouth daily.    [provider]    Allergies: Metronidazole    Review of Systems  Neurological:  Positive for headaches.  All other systems reviewed and are negative.   Updated Vital Signs BP 128/88 (BP Location: Right Arm)   Pulse 67    Temp 98 F (36.7 C) (Oral)   Resp 16   SpO2 99%   Physical Exam Vitals and nursing note reviewed.  Constitutional:      General: She is not in acute distress.    Appearance: Normal appearance. She is normal weight. She is not ill-appearing, toxic-appearing or diaphoretic.  HENT:     Head: Normocephalic and atraumatic.  Eyes:     Extraocular Movements: Extraocular movements intact.     Pupils: Pupils are equal, round, and reactive to light.  Neck:     Comments: No meningismus Cardiovascular:     Rate and Rhythm: Normal rate.  Pulmonary:     Effort: Pulmonary effort is normal. No respiratory distress.  Musculoskeletal:        General: Normal range of motion.     Cervical back: Normal range of motion and neck supple.  Skin:    General: Skin is warm and dry.  Neurological:     General: No focal deficit present.     Mental Status: She is alert and oriented to person, place, and time.     Comments: Alert and oriented and neurologically intact without focal deficits  Psychiatric:        Mood and Affect: Mood normal.        Behavior: Behavior normal.     (all labs ordered are listed, but only abnormal results are displayed) Labs Reviewed  CBC - Abnormal; Notable for the following components:      Result Value  Platelets 446 (*)    All other components within normal limits  HCG, SERUM, QUALITATIVE  BASIC METABOLIC PANEL WITH GFR    EKG: None  Radiology: No results found.   Procedures   Medications Ordered in the ED  prochlorperazine  (COMPAZINE ) injection 10 mg (10 mg Intravenous Given 11/18/23 1350)  diphenhydrAMINE  (BENADRYL ) injection 12.5 mg (12.5 mg Intravenous Given 11/18/23 1350)  dexamethasone  (DECADRON ) injection 10 mg (10 mg Intravenous Given 11/18/23 1355)  ketorolac  (TORADOL ) 15 MG/ML injection 15 mg (15 mg Intravenous Given 11/18/23 1440)  sodium chloride  0.9 % bolus 1,000 mL (0 mLs Intravenous Stopped 11/18/23 1440)                                     Medical Decision Making Amount and/or Complexity of Data Reviewed Labs: ordered.  Risk Prescription drug management.   This patient is a 28 y.o. female who presents to the ED for concern of migraine, this involves an extensive number of treatment options, and is a complaint that carries with it a high risk of complications and morbidity. The emergent differential diagnosis prior to evaluation includes, but is not limited to,  Stroke, increased ICP, meningitis, CVA, intracranial tumor, venous sinus thrombosis, migraine, cluster headache, hypertension, drug related, head injury, tension headache, sinusitis, dental abscess, otitis media, TMJ, temporal arteritis, glaucoma, trigeminal neuralgia.  This is not an exhaustive differential.   Past Medical History / Co-morbidities / Social History:  has a past medical history of Anxiety and Migraine.  Additional history: Chart reviewed. Pertinent results include: has several previous ER visits for headaches previously, usually gets toradol  compazine  and decadron . Has had normal CT previously, last in 4/25  Physical Exam: Physical exam performed. The pertinent findings include: Alert and oriented and neurologically intact without focal deficits  Lab Tests: I ordered, and personally interpreted labs.  The pertinent results include:  no acute laboratory abnormalities   Medications: I ordered medication including compazine , benadryl , decadron , toradol , fluids  for headache. Reevaluation of the patient after these medicines showed that the patient improved. I have reviewed the patients home medicines and have made adjustments as needed.   Disposition: After consideration of the diagnostic results and the patients response to treatment, I feel that emergency department workup does not suggest an emergent condition requiring admission or immediate intervention beyond what has been performed at this time. The plan is: Discharge with close outpatient  follow-up and return precautions.  After above interventions, patient's symptoms have completely resolved and she feels ready to go home.  Given this, no indication for further evaluation of the imaging at this time.  Discussed same with patient is understanding and in agreement with this. Evaluation and diagnostic testing in the emergency department does not suggest an emergent condition requiring admission or immediate intervention beyond what has been performed at this time.  Plan for discharge with close PCP follow-up.  Patient is understanding and amenable with plan, educated on red flag symptoms that would prompt immediate return.  Patient discharged in stable condition.  Final diagnoses:  Bad headache    ED Discharge Orders     None     An After Visit Summary was printed and given to the patient.      Mikael Skoda A, PA-C 11/18/23 1506    Ruthe Cornet, DO 11/18/23 1530

## 2023-11-18 NOTE — ED Triage Notes (Signed)
 Pt c/o migraine x3-4 days, just can't get rid of it. Hx of same, tried taking RX & tylenol / ibuprofen , it's not going anywhere. Light sensitivity, nausea  No meds PTA

## 2023-11-25 ENCOUNTER — Encounter (HOSPITAL_BASED_OUTPATIENT_CLINIC_OR_DEPARTMENT_OTHER): Payer: Self-pay | Admitting: Emergency Medicine

## 2023-11-25 ENCOUNTER — Other Ambulatory Visit: Payer: Self-pay

## 2023-11-25 ENCOUNTER — Emergency Department (HOSPITAL_BASED_OUTPATIENT_CLINIC_OR_DEPARTMENT_OTHER)
Admission: EM | Admit: 2023-11-25 | Discharge: 2023-11-25 | Disposition: A | Discharge status: Home / Self Care | Payer: MEDICAID | Attending: Emergency Medicine | Admitting: Emergency Medicine

## 2023-11-25 ENCOUNTER — Emergency Department (HOSPITAL_BASED_OUTPATIENT_CLINIC_OR_DEPARTMENT_OTHER): Payer: MEDICAID

## 2023-11-25 DIAGNOSIS — G43809 Other migraine, not intractable, without status migrainosus: Secondary | ICD-10-CM | POA: Diagnosis not present

## 2023-11-25 DIAGNOSIS — N390 Urinary tract infection, site not specified: Secondary | ICD-10-CM | POA: Diagnosis not present

## 2023-11-25 DIAGNOSIS — R519 Headache, unspecified: Secondary | ICD-10-CM | POA: Diagnosis present

## 2023-11-25 LAB — HCG, SERUM, QUALITATIVE: Preg, Serum: NEGATIVE

## 2023-11-25 LAB — BASIC METABOLIC PANEL WITH GFR
Anion gap: 14 (ref 5–15)
BUN: 7 mg/dL (ref 6–20)
CO2: 23 mmol/L (ref 22–32)
Calcium: 9.1 mg/dL (ref 8.9–10.3)
Chloride: 100 mmol/L (ref 98–111)
Creatinine, Ser: 0.77 mg/dL (ref 0.44–1.00)
GFR, Estimated: >60 mL/min (ref >60)
Glucose, Bld: 92 mg/dL (ref 70–99)
Potassium: 3.5 mmol/L (ref 3.5–5.1)
Sodium: 138 mmol/L (ref 135–145)

## 2023-11-25 LAB — CBC WITH DIFFERENTIAL/PLATELET
Abs Immature Granulocytes: 0.02 K/uL (ref 0.00–0.07)
Basophils Absolute: 0 K/uL (ref 0.0–0.1)
Basophils Relative: 0 %
Eosinophils Absolute: 0 K/uL (ref 0.0–0.5)
Eosinophils Relative: 0 %
HCT: 35.8 % — ABNORMAL LOW (ref 36.0–46.0)
Hemoglobin: 11.7 g/dL — ABNORMAL LOW (ref 12.0–15.0)
Immature Granulocytes: 0 %
Lymphocytes Relative: 24 %
Lymphs Abs: 2.2 K/uL (ref 0.7–4.0)
MCH: 28.1 pg (ref 26.0–34.0)
MCHC: 32.7 g/dL (ref 30.0–36.0)
MCV: 86.1 fL (ref 80.0–100.0)
Monocytes Absolute: 0.5 K/uL (ref 0.1–1.0)
Monocytes Relative: 5 %
Neutro Abs: 6.3 K/uL (ref 1.7–7.7)
Neutrophils Relative %: 71 %
Platelets: 435 K/uL — ABNORMAL HIGH (ref 150–400)
RBC: 4.16 MIL/uL (ref 3.87–5.11)
RDW: 12.6 % (ref 11.5–15.5)
WBC: 9 K/uL (ref 4.0–10.5)
nRBC: 0 % (ref 0.0–0.2)

## 2023-11-25 LAB — URINALYSIS, ROUTINE W REFLEX MICROSCOPIC
Bilirubin Urine: NEGATIVE
Glucose, UA: NEGATIVE mg/dL
Ketones, ur: 15 mg/dL — AB
Leukocytes,Ua: NEGATIVE
Nitrite: NEGATIVE
Specific Gravity, Urine: 1.024 (ref 1.005–1.030)
pH: 5.5 (ref 5.0–8.0)

## 2023-11-25 MED ORDER — CEPHALEXIN 500 MG PO CAPS: 500.0000 mg | ORAL_CAPSULE | Freq: Four times a day (QID) | ORAL | 0 refills | Status: DC

## 2023-11-25 MED ORDER — SODIUM CHLORIDE 0.9 % IV BOLUS
1000.0000 mL | Freq: Once | INTRAVENOUS | Status: AC
  Administered 2023-11-25: 1000 mL via INTRAVENOUS

## 2023-11-25 MED ORDER — BUTALBITAL-APAP-CAFFEINE 50-325-40 MG PO TABS: 1.0000 | ORAL_TABLET | Freq: Four times a day (QID) | ORAL | 0 refills | Status: DC | PRN

## 2023-11-25 MED ORDER — DIPHENHYDRAMINE HCL 50 MG/ML IJ SOLN
12.5000 mg | Freq: Once | INTRAMUSCULAR | Status: AC
  Administered 2023-11-25: 12.5 mg via INTRAVENOUS
  Filled 2023-11-25: qty 1

## 2023-11-25 MED ORDER — MAGNESIUM SULFATE 2 GM/50ML IV SOLN
2.0000 g | Freq: Once | INTRAVENOUS | Status: AC
  Administered 2023-11-25: 2 g via INTRAVENOUS
  Filled 2023-11-25: qty 50

## 2023-11-25 MED ORDER — PROCHLORPERAZINE EDISYLATE 10 MG/2ML IJ SOLN
10.0000 mg | Freq: Once | INTRAMUSCULAR | Status: DC
  Filled 2023-11-25: qty 2

## 2023-11-25 NOTE — ED Provider Notes (Signed)
 [place] EMERGENCY DEPARTMENT AT [facility] Provider Note   CSN: [id number] Arrival date & time: 11/25/23  1944     Patient presents with: Headache   Chosen Kathleen Swanson is a 28 y.o. female for continued headache which she describes as the crown of her head and has been persistent despite taking prednisone as previously prescribed at family practice.  Seen for same on 4 August for same, received corticosteroid injection as well as Medrol Dosepak and Protonix/Carafate for reflux.  She denies any changes in her vision, no visual disturbances, denies any dizziness/vertigo.  Intermittent nausea without vomiting.  Discontinued prednisone secondary to GI symptoms.      Headache      Prior to Admission medications   Medication Sig Start Date End Date Taking? Authorizing Provider  butalbital -acetaminophen -caffeine  (FIORICET) 50-325-40 MG tablet Take by mouth. 03/26/22   [provider]  cephALEXin  (KEFLEX ) 500 MG capsule Take 1 capsule (500 mg total) by mouth 4 (four) times daily. 11/25/23   [name], PA  cetirizine  (ZYRTEC  ALLERGY) 10 MG tablet Take 1 tablet (10 mg total) by mouth daily. 02/17/20   [name], PA-C  Cyanocobalamin (B-12 PO) Take by mouth.    [provider]  Ferrous Sulfate (IRON PO) Take by mouth.    [provider]  hydrOXYzine  (VISTARIL ) 50 MG capsule Take 50 mg by mouth at bedtime.    [provider]  ondansetron  (ZOFRAN ) 4 MG tablet Take 1 tablet (4 mg total) by mouth every 8 (eight) hours as needed for nausea or vomiting. Patient not taking: Reported on 04/07/2023 09/26/19   Knapp, Iva, MD  oxyCODONE  (ROXICODONE ) 5 MG immediate release tablet Take 1 tablet (5 mg total) by mouth every 6 (six) hours as needed for severe pain. 09/04/22   Curvin Deward MOULD, MD  valACYclovir (VALTREX) 500 MG tablet Take 500 mg by mouth daily.    [provider]    Allergies: Metronidazole    Review of Systems  Neurological:   Positive for headaches.  All other systems reviewed and are negative.   Updated Vital Signs BP 131/85   Pulse 70   Temp 97.9 F (36.6 C)   Resp 18   Wt 73.5 kg   LMP 11/10/2023 (Exact Date)   SpO2 100%   BMI 27.81 kg/m   Physical Exam Vitals and nursing note reviewed.  Constitutional:      General: She is not in acute distress.    Appearance: Normal appearance.  HENT:     Head: Normocephalic and atraumatic.     Mouth/Throat:     Mouth: Mucous membranes are moist.     Pharynx: Oropharynx is clear.  Eyes:     General: No visual field deficit.    Extraocular Movements: Extraocular movements intact.     Conjunctiva/sclera: Conjunctivae normal.     Pupils: Pupils are equal, round, and reactive to light.  Cardiovascular:     Rate and Rhythm: Normal rate and regular rhythm.     Pulses: Normal pulses.     Heart sounds: Normal heart sounds. No murmur heard.    No friction rub. No gallop.  Pulmonary:     Effort: Pulmonary effort is normal.     Breath sounds: Normal breath sounds.  Abdominal:     General: Abdomen is flat. Bowel sounds are normal.     Palpations: Abdomen is soft.  Musculoskeletal:        General: Normal range of motion.     Cervical back:  Normal range of motion and neck supple.     Right lower leg: No edema.     Left lower leg: No edema.  Skin:    General: Skin is warm and dry.     Capillary Refill: Capillary refill takes less than 2 seconds.  Neurological:     General: No focal deficit present.     Mental Status: She is alert and oriented to person, place, and time. Mental status is at baseline.     GCS: GCS eye subscore is 4. GCS verbal subscore is 5. GCS motor subscore is 6.     Cranial Nerves: No cranial nerve deficit, dysarthria or facial asymmetry.     Sensory: No sensory deficit.     Motor: No weakness.     Coordination: Coordination normal.  Psychiatric:        Mood and Affect: Mood normal.        Speech: Speech normal.        Behavior:  Behavior normal.     (all labs ordered are listed, but only abnormal results are displayed) Labs Reviewed  CBC WITH DIFFERENTIAL/PLATELET - Abnormal; Notable for the following components:      Result Value   Hemoglobin 11.7 (*)    HCT 35.8 (*)    Platelets 435 (*)    All other components within normal limits  URINALYSIS, ROUTINE W REFLEX MICROSCOPIC - Abnormal; Notable for the following components:   Color, Urine ORANGE (*)    APPearance CLOUDY (*)    Hgb urine dipstick MODERATE (*)    Ketones, ur 15 (*)    Protein, ur TRACE (*)    Bacteria, UA RARE (*)    All other components within normal limits  BASIC METABOLIC PANEL WITH GFR  HCG, SERUM, QUALITATIVE    EKG: None  Radiology: CT Head Wo Contrast Result Date: 11/25/2023 CLINICAL DATA:  Headache, increasing frequency or severity EXAM: CT HEAD WITHOUT CONTRAST TECHNIQUE: Contiguous axial images were obtained from the base of the skull through the vertex without intravenous contrast. RADIATION DOSE REDUCTION: This exam was performed according to the departmental dose-optimization program which includes automated exposure control, adjustment of the mA and/or kV according to patient size and/or use of iterative reconstruction technique. COMPARISON:  CT head 08/04/2023 FINDINGS: Brain: No intracranial hemorrhage, mass effect, or evidence of acute infarct. No hydrocephalus. No extra-axial fluid collection. Vascular: No hyperdense vessel or unexpected calcification. Skull: No fracture or focal lesion. Sinuses/Orbits: No acute finding. Other: None. IMPRESSION: No acute intracranial abnormality. Electronically Signed   By: Norman Gatlin M.D.   On: 11/25/2023 20:54     Procedures   Medications Ordered in the ED  magnesium  sulfate IVPB 2 g 50 mL (0 g Intravenous Stopped 11/25/23 2117)  diphenhydrAMINE  (BENADRYL ) injection 12.5 mg (12.5 mg Intravenous Given 11/25/23 2027)  sodium chloride  0.9 % bolus 1,000 mL (0 mLs Intravenous Stopped 11/25/23  2144)                                    Medical Decision Making Amount and/or Complexity of Data Reviewed Labs: ordered. Radiology: ordered.  Risk Prescription drug management.   Medical Decision Making:   Claudean Leavelle is a 28 y.o. female who presented to the ED today with headache detailed above.     Complete initial physical exam performed, notably the patient  was alert and oriented in no apparent distress with benign neurologic  exam.    Reviewed and confirmed nursing documentation for past medical history, family history, social history.    Initial Assessment:   With the patient's presentation of headache, differential includes sinus headache, tension headache, migraines.  Further considered acute neurovascular insult.  She does not have any acute neurologic findings making this differential less likely.  Initial Plan:  Provide IV magnesium /Benadryl /IV fluids for headache management.  Initially was going to provide Compazine  however patient declined secondary to drowsiness. Screening labs including CBC and Metabolic panel to evaluate for infectious or metabolic etiology of disease.  Urinalysis with reflex culture ordered to evaluate for UTI or relevant urologic/nephrologic pathology.  CT head to evaluate for intracranial/neurovascular insult. Objective evaluation as below reviewed   Initial Study Results:   Laboratory  All laboratory results reviewed without evidence of clinically relevant pathology.   Exceptions include: Hemoglobin is reduced at 11.7, platelets mildly elevated at 435.  Urine shows orange appearance with moderate hemoglobin, proteinuria, ketonuria, and rare bacturia.    Radiology:  All images reviewed independently. Agree with radiology report at this time.   CT Head Wo Contrast Result Date: 11/25/2023 CLINICAL DATA:  Headache, increasing frequency or severity EXAM: CT HEAD WITHOUT CONTRAST TECHNIQUE: Contiguous axial images were obtained from the base of  the skull through the vertex without intravenous contrast. RADIATION DOSE REDUCTION: This exam was performed according to the departmental dose-optimization program which includes automated exposure control, adjustment of the mA and/or kV according to patient size and/or use of iterative reconstruction technique. COMPARISON:  CT head 08/04/2023 FINDINGS: Brain: No intracranial hemorrhage, mass effect, or evidence of acute infarct. No hydrocephalus. No extra-axial fluid collection. Vascular: No hyperdense vessel or unexpected calcification. Skull: No fracture or focal lesion. Sinuses/Orbits: No acute finding. Other: None. IMPRESSION: No acute intracranial abnormality. Electronically Signed   By: Norman Gatlin M.D.   On: 11/25/2023 20:54    Reassessment and Plan:   Extensive assessment of this patient along with workup shows the CT scan does not have any acute abnormalities.  She does show a mild anemia of 11.7 however does not have any dizziness, generalized weakness, or dyspnea to suggest symptomatic anemia.  She does have lower pelvic discomfort, and in line with urine findings will empirically treat with Keflex  to manage.  Further with findings of her new onset of asymptomatic anemia along with the urine findings, have patient follow-up with primary care in the next 2 weeks for reassessment.  Outpatient referral for neurology has been given for chronic headaches.  She will continue to use her previous prescription of Fioricet, and will wait for contact by neurology within the next 48 hours.       Final diagnoses:  Other migraine without status migrainosus, not intractable  Urinary tract infection without hematuria, site unspecified    ED Discharge Orders          Ordered    Ambulatory referral to Neurology       Comments: An appointment is requested in approximately: 1 week   11/25/23 2006    butalbital -acetaminophen -caffeine  (FIORICET) 50-325-40 MG tablet  Every 6 hours PRN,   Status:   Discontinued        11/25/23 2142    cephALEXin  (KEFLEX ) 500 MG capsule  4 times daily,   Status:  Discontinued        11/25/23 2252    cephALEXin  (KEFLEX ) 500 MG capsule  4 times daily        11/25/23 2253  [name], PA 11/25/23 2259    Ruthe Cornet, DO 11/29/23 1314

## 2023-11-25 NOTE — ED Notes (Signed)
Urine sent to lab at this time.

## 2023-11-25 NOTE — ED Triage Notes (Signed)
 Pt in with c/o migraine x several days. States she was seen recently for same and placed on a Prednisone pack but had to stop it due to the diarrhea it caused. Pain 8/10 currently

## 2023-11-26 NOTE — Telephone Encounter (Signed)
 Pt was called and told per provider he will respond to her message when he has time to look at it

## 2023-11-26 NOTE — Telephone Encounter (Signed)
 Patient was calling in about her medication request.   Patient had asked the provider if her inflammation (based on her results) may be contributing to her migraines. Patient was hoping to have something sent in for her for inflammation. She has not gotten her migraine medication yet because she wanted to pick up everything at once.   I spoke to [place] - the provider was in a room currently. She stated that she would get the message to him and [place].   [phone]  Ok to leave message on call back number

## 2023-11-29 ENCOUNTER — Other Ambulatory Visit: Payer: Self-pay

## 2023-11-29 ENCOUNTER — Emergency Department (HOSPITAL_BASED_OUTPATIENT_CLINIC_OR_DEPARTMENT_OTHER)
Admission: EM | Admit: 2023-11-29 | Discharge: 2023-11-29 | Disposition: A | Discharge status: Home / Self Care | Payer: MEDICAID | Attending: Emergency Medicine | Admitting: Emergency Medicine

## 2023-11-29 DIAGNOSIS — R519 Headache, unspecified: Secondary | ICD-10-CM | POA: Insufficient documentation

## 2023-11-29 MED ORDER — ONDANSETRON 4 MG PO TBDP
4.0000 mg | ORAL_TABLET | Freq: Three times a day (TID) | ORAL | 0 refills | Status: AC | PRN
Start: 2023-11-29 — End: ?

## 2023-11-29 MED ORDER — ALUM & MAG HYDROXIDE-SIMETH 200-200-20 MG/5ML PO SUSP
30.0000 mL | Freq: Once | ORAL | Status: AC
  Administered 2023-11-29 (×2): 30 mL via ORAL
  Filled 2023-11-29: qty 30

## 2023-11-29 MED ORDER — FAMOTIDINE 20 MG PO TABS
20.0000 mg | ORAL_TABLET | Freq: Once | ORAL | Status: AC
  Administered 2023-11-29 (×2): 20 mg via ORAL
  Filled 2023-11-29: qty 1

## 2023-11-29 MED ORDER — KETOROLAC TROMETHAMINE 15 MG/ML IJ SOLN
15.0000 mg | Freq: Once | INTRAMUSCULAR | Status: AC
  Administered 2023-11-29 (×2): 15 mg via INTRAVENOUS
  Filled 2023-11-29: qty 1

## 2023-11-29 MED ORDER — DIPHENHYDRAMINE HCL 50 MG/ML IJ SOLN
25.0000 mg | Freq: Once | INTRAMUSCULAR | Status: AC
  Administered 2023-11-29 (×2): 25 mg via INTRAVENOUS
  Filled 2023-11-29: qty 1

## 2023-11-29 MED ORDER — METOCLOPRAMIDE HCL 5 MG/ML IJ SOLN
10.0000 mg | Freq: Once | INTRAMUSCULAR | Status: AC
  Administered 2023-11-29 (×2): 10 mg via INTRAVENOUS
  Filled 2023-11-29: qty 2

## 2023-11-29 MED ORDER — DEXAMETHASONE SODIUM PHOSPHATE 10 MG/ML IJ SOLN
10.0000 mg | Freq: Once | INTRAMUSCULAR | Status: AC
  Administered 2023-11-29 (×2): 10 mg via INTRAVENOUS
  Filled 2023-11-29: qty 1

## 2023-11-29 MED ORDER — RIZATRIPTAN BENZOATE 10 MG PO TABS: 10.0000 mg | ORAL_TABLET | ORAL | 0 refills | Status: DC | PRN

## 2023-11-29 MED ORDER — SODIUM CHLORIDE 0.9 % IV BOLUS
1000.0000 mL | Freq: Once | INTRAVENOUS | Status: AC
  Administered 2023-11-29 (×2): 1000 mL via INTRAVENOUS

## 2023-11-29 NOTE — Telephone Encounter (Signed)
 Copied from CRM #[id number]. Topic: Referral - Existing Referral >> Nov 29, 2023  8:33 AM Kathleen Swanson wrote: Gross, [name] is calling for clinical concerns (Ask: What symptoms are you calling about today, AND how long have you had these symptoms? Must Review HPKW list for symptoms) Document Name of Triage Nurse/BH Rep taking the call when applicable)   Include all details related to the request(s) below: Patient is requesting a change of neurology offices within referral because the first appointment she can get in is 11/ 12 and she would like to be seen before then because she is still having ongoing head pains  . Tompax also gave patient ringing ears and chest and head felt heavy. Is there anything else she can take     Confirm and type the Best Contact Number below:  Patient/caller contact number:    [phone] / Kimiyah          [] Home  [x] Mobile  [] Work [] Other   [x] Okay to leave a voicemail   Medication List:  Current Outpatient Medications:  .  butalbital -acetaminophen -caffeine  (FIORICET) 50-325-40 mg per tablet, TAKE 1-2 TABLETS EVERY 4 HOURS AS NEEDED FOR MIGRAINES - MAX 6 TABLETS DAILY (4 GRAMS/DAY OF ACETAMINOPHEN ), Disp: 30 tablet, Rfl: 0 .  docusate sodium (COLACE) 100 mg capsule, TAKE 1 CAPSULE BY MOUTH 2 TIMES DAILY AS NEEDED FOR CONSTIPATION., Disp: 60 capsule, Rfl: 1 .  ferrous sulfate 325 mg (65 mg iron) tablet, Take 325 mg by mouth every other day., Disp: 30 tablet, Rfl: 2 .  fluticasone propionate (FLONASE) 50 mcg/spray nasal spray, Administer two sprays into each nostril daily., Disp: 16 g, Rfl: 1 .  hydrOXYzine  pamoate (VISTARIL ) 25 mg capsule, Take 1 capsule (25 mg total) by mouth every 8 (eight) hours as needed for anxiety., Disp: 30 capsule, Rfl: 1 .  omeprazole magnesium  (PriLOSEC) 20 mg EC tablet, Take 1 tablet (20 mg total) by mouth daily., Disp: 90 tablet, Rfl: 0 .  sucralfate (Carafate) 1 gram tablet, Take 1 tablet (1 g total) by mouth 4 (four) times a day  for 14 days., Disp: 56 tablet, Rfl: 0 .  topiramate (TOPAMAX) 25 mg tablet, Take 1 tablet (25 mg total) by mouth nightly for 7 days., Disp: 7 tablet, Rfl: 0 .  valACYclovir (VALTREX) 500 mg tablet, TAKE 1 TABLET(500 MG) BY MOUTH DAILY, Disp: 90 tablet, Rfl: 4     Medication Request/Refills: Pharmacy Information (if applicable)   [] Not Applicable       []  Pharmacy listed  Send Medication Request to:                                                 [] Pharmacy not listed (added to pharmacy list in Epic) Send Medication Request to:      Listed Pharmacies: [id number] - [place], [place] - [address] AT [facility] OF [address] - PHONE: [phone] - FAX: [phone] [id number] - [place], [place] - [address] AT [facility] [address] - PHONE: [phone] - FAX: [phone]

## 2023-11-29 NOTE — ED Provider Notes (Signed)
 [place] EMERGENCY DEPARTMENT AT [facility] Provider Note   CSN: [id number] Arrival date & time: 11/29/23  9096     Patient presents with: Migraine   Kathleen Swanson is a 28 y.o. female.    Migraine   28 year old female presents emergency department with headache.  Has been having intermittent headache over the past week and a half or so.  Is on Fioricet at home which has not been helping very much.  Also took some Topamax but stopped taking this secondary to adverse side effect.  Does state she has an upcoming appointment with neurology in November of this year.  Denies any visual symptoms, gait abnormality, slurred speech, facial droop, weakness/sensory deficits in upper or lower extremities, fever.  Describes headache in the crown of her head without radiation.  Has been seen multiple times for headaches in the past with CT scan most recently 5 days ago that was negative.  Presents for reassessment.  Past medical history significant for migraine, anxiety  Prior to Admission medications   Medication Sig Start Date End Date Taking? Authorizing Provider  butalbital -acetaminophen -caffeine  (FIORICET) 50-325-40 MG tablet Take by mouth. 03/26/22   [provider]  cephALEXin  (KEFLEX ) 500 MG capsule Take 1 capsule (500 mg total) by mouth 4 (four) times daily. 11/25/23   [name], PA  cetirizine  (ZYRTEC  ALLERGY) 10 MG tablet Take 1 tablet (10 mg total) by mouth daily. 02/17/20   [name], PA-C  Cyanocobalamin (B-12 PO) Take by mouth.    [provider]  Ferrous Sulfate (IRON PO) Take by mouth.    [provider]  hydrOXYzine  (VISTARIL ) 50 MG capsule Take 50 mg by mouth at bedtime.    [provider]  ondansetron  (ZOFRAN ) 4 MG tablet Take 1 tablet (4 mg total) by mouth every 8 (eight) hours as needed for nausea or vomiting. Patient not taking: Reported on 04/07/2023 09/26/19   Knapp, Iva, MD  oxyCODONE  (ROXICODONE ) 5 MG immediate  release tablet Take 1 tablet (5 mg total) by mouth every 6 (six) hours as needed for severe pain. 09/04/22   Curvin Deward MOULD, MD  valACYclovir (VALTREX) 500 MG tablet Take 500 mg by mouth daily.    [provider]    Allergies: Metronidazole    Review of Systems  All other systems reviewed and are negative.   Updated Vital Signs BP 120/74   Pulse 80   Temp 98 F (36.7 C) (Oral)   Resp 20   Ht 5' 4 (1.626 m)   Wt 68 kg   LMP 11/10/2023 (Exact Date)   SpO2 98%   BMI 25.75 kg/m   Physical Exam Vitals and nursing note reviewed.  Constitutional:      General: She is not in acute distress.    Appearance: She is well-developed.  HENT:     Head: Normocephalic and atraumatic.  Eyes:     Conjunctiva/sclera: Conjunctivae normal.  Cardiovascular:     Rate and Rhythm: Normal rate and regular rhythm.     Heart sounds: No murmur heard. Pulmonary:     Effort: Pulmonary effort is normal. No respiratory distress.     Breath sounds: Normal breath sounds.  Abdominal:     Palpations: Abdomen is soft.     Tenderness: There is no abdominal tenderness.  Musculoskeletal:        General: No swelling.     Cervical back: Normal range of motion and neck supple. No rigidity.  Lymphadenopathy:     Cervical: No  cervical adenopathy.  Skin:    General: Skin is warm and dry.     Capillary Refill: Capillary refill takes less than 2 seconds.  Neurological:     Mental Status: She is alert.     Comments: Alert and oriented to self, place, time and event.   Speech is fluent, clear without dysarthria or dysphasia.   Strength 5/5 in upper/lower extremities   Sensation intact in upper/lower extremities   Normal gait.  CN I not tested  CN II not tested CN III, IV, VI PERRLA and EOMs intact bilaterally  CN V Intact sensation to sharp and light touch to the face  CN VII facial movements symmetric  CN VIII not tested  CN IX, X no uvula deviation, symmetric rise of soft palate  CN XI 5/5  SCM and trapezius strength bilaterally  CN XII Midline tongue protrusion, symmetric L/R movements     Psychiatric:        Mood and Affect: Mood normal.     (all labs ordered are listed, but only abnormal results are displayed) Labs Reviewed - No data to display  EKG: None  Radiology: No results found.   Procedures   Medications Ordered in the ED  metoCLOPramide  (REGLAN ) injection 10 mg (has no administration in time range)  ketorolac  (TORADOL ) 15 MG/ML injection 15 mg (has no administration in time range)  diphenhydrAMINE  (BENADRYL ) injection 25 mg (has no administration in time range)  dexamethasone  (DECADRON ) injection 10 mg (has no administration in time range)                                    Medical Decision Making Risk Prescription drug management.   This patient presents to the ED for concern of headache, this involves an extensive number of treatment options, and is a complaint that carries with it a high risk of complications and morbidity.  The differential diagnosis includes migraine/tension/cluster headache, meningitis/encephalitis, carotid/vertebral artery dissection, cerebral venous thrombosis, other   Co morbidities that complicate the patient evaluation  See HPI   Additional history obtained:  Additional history obtained from EMR External records from outside source obtained and reviewed including hospital records   Lab Tests:  N/a   Imaging Studies ordered:  N/a   Cardiac Monitoring: / EKG:  The patient was maintained on a cardiac monitor.  I personally viewed and interpreted the cardiac monitored which showed an underlying rhythm of: sinus rhythm   Consultations Obtained:  N/a   Problem List / ED Course / Critical interventions / Medication management  Headache I ordered medication including Toradol , Reglan , Benadryl , dexamethasone , normal saline  Reevaluation of the patient after these medicines showed that the patient  improved I have reviewed the patients home medicines and have made adjustments as needed   Social Determinants of Health:  Denies tobacco, licit drug use.   Test / Admission - Considered:  Headache Vitals signs within normal range and stable throughout visit. 28 year old female presents emergency department with headache.  Has been having intermittent headache over the past week and a half or so.  Is on Fioricet at home which has not been helping very much.  Also took some Topamax but stopped taking this secondary to adverse side effect.  Does state she has an upcoming appointment with neurology in November of this year.  Denies any visual symptoms, gait abnormality, slurred speech, facial droop, weakness/sensory deficits in upper or lower  extremities, fever.  Describes headache in the crown of her head without radiation.  Has been seen multiple times for headaches in the past with CT scan most recently 5 days ago that was negative.  Presents for reassessment. On exam, nonfocal neurologic exam.  No evidence clinically of meningismus.  Treated migraine cocktail and noted resolution of headache.  Patient did have neuroimaging 5 days ago that was otherwise unremarkable.  Given similar experience of headache that she has had in the past with resolution with migraine cocktail, further neuroimaging was deferred after shared decision making conversation was had with patient.  Will trial rizatriptan  as needed for headache pending neurology follow-up in the outpatient setting.  Patient overall well-appearing, afebrile in no acute distress Worrisome signs and symptoms were discussed with the patient, and the patient acknowledged understanding to return to the ED if noticed. Patient was stable upon discharge.       Final diagnoses:  None    ED Discharge Orders     None          [name], [place] 11/29/23 1049    Dasie Faden, MD 11/30/23 [phone]

## 2023-11-29 NOTE — ED Notes (Signed)
 Pt alert and oriented X 4 at the time of discharge. RR even and unlabored. No acute distress noted. Pt verbalized understanding of discharge instructions as discussed. Pt ambulatory to lobby at time of discharge.

## 2023-11-29 NOTE — ED Triage Notes (Signed)
 Pt caox4, ambulatory c/o migraine x1.5 wks, has been seen in ED multiple times for same, was taking Fioricet but states that the med doesn't help. Follow up with neuro scheduled for nov but states she can't wait that long. States she wants it noted that she has PMH [place] 1.

## 2023-11-29 NOTE — Discharge Instructions (Signed)
 As discussed, we will try a different medication called rizatriptan  to take as needed for headache.  Recommend follow-up with neurology in the outpatient setting.  Please not hesitate to return if the worrisome signs and symptoms discussed become apparent.

## 2023-11-30 ENCOUNTER — Emergency Department (HOSPITAL_BASED_OUTPATIENT_CLINIC_OR_DEPARTMENT_OTHER): Payer: MEDICAID | Admitting: Radiology

## 2023-11-30 ENCOUNTER — Encounter (HOSPITAL_BASED_OUTPATIENT_CLINIC_OR_DEPARTMENT_OTHER): Payer: Self-pay

## 2023-11-30 ENCOUNTER — Emergency Department (HOSPITAL_BASED_OUTPATIENT_CLINIC_OR_DEPARTMENT_OTHER)
Admission: EM | Admit: 2023-11-30 | Discharge: 2023-11-30 | Disposition: A | Discharge status: Home / Self Care | Payer: MEDICAID | Attending: Emergency Medicine | Admitting: Emergency Medicine

## 2023-11-30 DIAGNOSIS — R079 Chest pain, unspecified: Secondary | ICD-10-CM | POA: Diagnosis present

## 2023-11-30 LAB — TROPONIN T, HIGH SENSITIVITY
Troponin T High Sensitivity: <15 ng/L (ref <19)
Troponin T High Sensitivity: <15 ng/L (ref <19)

## 2023-11-30 LAB — BASIC METABOLIC PANEL WITH GFR
Anion gap: 16 — ABNORMAL HIGH (ref 5–15)
BUN: 7 mg/dL (ref 6–20)
CO2: 21 mmol/L — ABNORMAL LOW (ref 22–32)
Calcium: 9.7 mg/dL (ref 8.9–10.3)
Chloride: 102 mmol/L (ref 98–111)
Creatinine, Ser: 0.83 mg/dL (ref 0.44–1.00)
GFR, Estimated: >60 mL/min (ref >60)
Glucose, Bld: 83 mg/dL (ref 70–99)
Potassium: 3.4 mmol/L — ABNORMAL LOW (ref 3.5–5.1)
Sodium: 138 mmol/L (ref 135–145)

## 2023-11-30 LAB — CBC
HCT: 40.1 % (ref 36.0–46.0)
Hemoglobin: 13.5 g/dL (ref 12.0–15.0)
MCH: 28.7 pg (ref 26.0–34.0)
MCHC: 33.7 g/dL (ref 30.0–36.0)
MCV: 85.3 fL (ref 80.0–100.0)
Platelets: 462 K/uL — ABNORMAL HIGH (ref 150–400)
RBC: 4.7 MIL/uL (ref 3.87–5.11)
RDW: 12.9 % (ref 11.5–15.5)
WBC: 10.2 K/uL (ref 4.0–10.5)
nRBC: 0 % (ref 0.0–0.2)

## 2023-11-30 LAB — PREGNANCY, URINE: Preg Test, Ur: NEGATIVE

## 2023-11-30 MED ORDER — KETOROLAC TROMETHAMINE 30 MG/ML IJ SOLN
30.0000 mg | Freq: Once | INTRAMUSCULAR | Status: AC
  Administered 2023-11-30 (×2): 30 mg via INTRAVENOUS
  Filled 2023-11-30: qty 1

## 2023-11-30 NOTE — ED Triage Notes (Signed)
 Patient reports left sided chest pain and left sided arm pain starting this morning. She reports being here recently for migraines. 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

## 2023-11-30 NOTE — ED Provider Notes (Signed)
 [place] EMERGENCY DEPARTMENT AT [facility] Provider Note   CSN: [id number] Arrival date & time: 11/30/23  9155     Patient presents with: Chest Pain   Kathleen Swanson is a 28 y.o. female.    Chest Pain    Patient has a history of anxiety as well as migraines.  Patient presents ED for evaluation of chest pain this morning.  Patient states she started having sharp pain on the left side of her chest that goes down her left arm.  Patient states it does not get worse with deep breathing.  She has not had any fevers or coughing.  No leg swelling.  Patient denies any history of PE or DVT.  No coronary artery disease.  Prior to Admission medications   Medication Sig Start Date End Date Taking? Authorizing Provider  hydrOXYzine  (VISTARIL ) 25 MG capsule Take 25 mg by mouth every 8 (eight) hours as needed for anxiety. 08/19/22  Yes [provider]  pantoprazole (PROTONIX) 40 MG tablet Take 40 mg by mouth every morning. 11/22/23  Yes [provider]  sucralfate (CARAFATE) 1 g tablet Take 1 g by mouth 4 (four) times daily. 11/22/23 12/06/23 Yes [provider]  topiramate (TOPAMAX) 25 MG tablet Take 25 mg by mouth at bedtime. 11/26/23 12/03/23 Yes [provider]  butalbital -acetaminophen -caffeine  (FIORICET) 50-325-40 MG tablet Take by mouth. 03/26/22   [provider]  cephALEXin  (KEFLEX ) 500 MG capsule Take 1 capsule (500 mg total) by mouth 4 (four) times daily. 11/25/23   [name], PA  cetirizine  (ZYRTEC  ALLERGY) 10 MG tablet Take 1 tablet (10 mg total) by mouth daily. 02/17/20   [name], PA-C  Cyanocobalamin (B-12 PO) Take by mouth.    [provider]  Ferrous Sulfate (IRON PO) Take by mouth.    [provider]  hydrOXYzine  (VISTARIL ) 50 MG capsule Take 50 mg by mouth at bedtime.    [provider]  ondansetron  (ZOFRAN ) 4 MG tablet Take 1 tablet (4 mg total) by mouth every 8 (eight) hours as needed for  nausea or vomiting. Patient not taking: Reported on 04/07/2023 09/26/19   Dionisio Aragones, Iva, MD  ondansetron  (ZOFRAN -ODT) 4 MG disintegrating tablet Take 1 tablet (4 mg total) by mouth every 8 (eight) hours as needed. 11/29/23   [name], PA  oxyCODONE  (ROXICODONE ) 5 MG immediate release tablet Take 1 tablet (5 mg total) by mouth every 6 (six) hours as needed for severe pain. 09/04/22   Curvin Deward MOULD, MD  rizatriptan  (MAXALT ) 10 MG tablet Take 1 tablet (10 mg total) by mouth as needed for migraine. May repeat in 2 hours if needed.  Do not exceed 2 doses in 24 hours. 11/29/23   [name], PA  valACYclovir (VALTREX) 500 MG tablet Take 500 mg by mouth daily.    [provider]    Allergies: Metronidazole    Review of Systems  Cardiovascular:  Positive for chest pain.    Updated Vital Signs BP 139/68   Pulse 70   Temp 98.4 F (36.9 C) (Oral)   Resp 13   LMP 11/10/2023 (Exact Date)   SpO2 99%   Physical Exam Vitals and nursing note reviewed.  Constitutional:      General: She is not in acute distress.    Appearance: She is well-developed.  HENT:     Head: Normocephalic and atraumatic.     Right Ear: External ear normal.     Left Ear: External ear normal.  Eyes:     General: No scleral icterus.       Right eye: No discharge.        Left eye: No discharge.     Conjunctiva/sclera: Conjunctivae normal.  Neck:     Trachea: No tracheal deviation.  Cardiovascular:     Rate and Rhythm: Normal rate and regular rhythm.  Pulmonary:     Effort: Pulmonary effort is normal. No respiratory distress.     Breath sounds: Normal breath sounds. No stridor. No wheezing or rales.  Chest:     Chest wall: No tenderness.  Abdominal:     General: Bowel sounds are normal. There is no distension.     Palpations: Abdomen is soft.     Tenderness: There is no abdominal tenderness. There is no guarding or rebound.  Musculoskeletal:        General: No tenderness or deformity.      Cervical back: Neck supple.  Skin:    General: Skin is warm and dry.     Findings: No rash.  Neurological:     General: No focal deficit present.     Mental Status: She is alert.     Cranial Nerves: No cranial nerve deficit, dysarthria or facial asymmetry.     Sensory: No sensory deficit.     Motor: No abnormal muscle tone or seizure activity.     Coordination: Coordination normal.  Psychiatric:        Mood and Affect: Mood normal.     (all labs ordered are listed, but only abnormal results are displayed) Labs Reviewed  BASIC METABOLIC PANEL WITH GFR - Abnormal; Notable for the following components:      Result Value   Potassium 3.4 (*)    CO2 21 (*)    Anion gap 16 (*)    All other components within normal limits  CBC - Abnormal; Notable for the following components:   Platelets 462 (*)    All other components within normal limits  PREGNANCY, URINE  TROPONIN T, HIGH SENSITIVITY  TROPONIN T, HIGH SENSITIVITY    EKG: EKG Interpretation Date/Time:  Tuesday November 30 2023 08:52:24 EDT Ventricular Rate:  76 PR Interval:  133 QRS Duration:  120 QT Interval:  580 QTC Calculation: 653 R Axis:   47  Text Interpretation: Sinus arrhythmia Nonspecific intraventricular conduction delay No significant change since last tracing Confirmed by Randol Simmonds [phone]) on 11/30/2023 9:03:27 AM  Radiology: [facility] Chest 2 View Result Date: 11/30/2023 CLINICAL DATA:  Chest pain EXAM: CHEST - 2 VIEW COMPARISON:  05/29/2021 FINDINGS: The heart size and mediastinal contours are within normal limits. Both lungs are clear. The visualized skeletal structures are unremarkable. Telemetry leads overlie the chest. IMPRESSION: No active cardiopulmonary disease. Electronically Signed   By: Camellia Candle M.D.   On: 11/30/2023 09:50     Procedures   Medications Ordered in the ED  ketorolac  (TORADOL ) 30 MG/ML injection 30 mg (30 mg Intravenous Given 11/30/23 0918)    Clinical Course as of 11/30/23 1125  Tue  Nov 30, 2023  0955 CBC normal.  Metabolic panel normal.  Troponin normal. [JK]  0956 Chest x-ray negative [JK]    Clinical Course User Index [JK] Randol Simmonds, MD             HEART Score: 0                Geneva (Revised) Score: 0, Geneva Score Interpretation: Low Risk Group: 7-9% incidence of pulmonary embolism from  several studies PERC Score: 0, PERC Score Interpretation: No need for further workup, as <2% chance of PE.  If no criteria are positive and clinicians pre-test probability is <15%, PERC Rule criteria are satisfied Medical Decision Making Problems Addressed: Chest pain, unspecified type: acute illness or injury that poses a threat to life or bodily functions  Amount and/or Complexity of Data Reviewed Labs: ordered. Decision-making details documented in ED Course. Radiology: ordered and independent interpretation performed.  Risk Prescription drug management.   Patient presents ED for evaluation of chest pain.  ED workup reassuring.  No evidence of pneumonia or pneumothorax on chest x-ray.  Patient is low risk for PE.  PERC negative.  Doubt PE  Symptoms atypical for ACS.  Low risk heart score.  Serial troponins normal and EKG reassuring.  Doubt acute cardiac etiology.  Unclear etiology.  Possibly musculoskeletal.  Possible stress or anxiety.  Patient is feeling better at this time.  Evaluation and diagnostic testing in the emergency department does not suggest an emergent condition requiring admission or immediate intervention beyond what has been performed at this time.  The patient is safe for discharge and has been instructed to return immediately for worsening symptoms, change in symptoms or any other concerns.     Final diagnoses:  Chest pain, unspecified type    ED Discharge Orders     None          Randol Simmonds, MD 11/30/23 1125

## 2023-11-30 NOTE — Discharge Instructions (Signed)
 The test today in the ED did not show any signs of heart attack blood clot pneumonia or other serious abnormality.  You can take over-the-counter Tylenol  ibuprofen  as needed.  Follow-up with your primary care doctor to be rechecked.  Return as needed for worsening symptoms

## 2023-12-01 ENCOUNTER — Emergency Department (HOSPITAL_BASED_OUTPATIENT_CLINIC_OR_DEPARTMENT_OTHER)
Admission: EM | Admit: 2023-12-01 | Discharge: 2023-12-01 | Disposition: A | Discharge status: Home / Self Care | Payer: MEDICAID | Attending: Emergency Medicine | Admitting: Emergency Medicine

## 2023-12-01 ENCOUNTER — Encounter (HOSPITAL_BASED_OUTPATIENT_CLINIC_OR_DEPARTMENT_OTHER): Payer: Self-pay | Admitting: Emergency Medicine

## 2023-12-01 ENCOUNTER — Other Ambulatory Visit: Payer: Self-pay

## 2023-12-01 DIAGNOSIS — R519 Headache, unspecified: Secondary | ICD-10-CM | POA: Diagnosis present

## 2023-12-01 LAB — URINALYSIS, W/ REFLEX TO CULTURE (INFECTION SUSPECTED)
Bacteria, UA: NONE SEEN
Bilirubin Urine: NEGATIVE
Glucose, UA: NEGATIVE mg/dL
Leukocytes,Ua: NEGATIVE
Nitrite: NEGATIVE
Protein, ur: NEGATIVE mg/dL
Specific Gravity, Urine: 1.024 (ref 1.005–1.030)
pH: 5.5 (ref 5.0–8.0)

## 2023-12-01 MED ORDER — KETOROLAC TROMETHAMINE 30 MG/ML IJ SOLN
30.0000 mg | Freq: Once | INTRAMUSCULAR | Status: AC
  Administered 2023-12-01 (×2): 30 mg via INTRAVENOUS
  Filled 2023-12-01: qty 1

## 2023-12-01 MED ORDER — PROCHLORPERAZINE EDISYLATE 10 MG/2ML IJ SOLN
10.0000 mg | Freq: Once | INTRAMUSCULAR | Status: AC
  Administered 2023-12-01 (×2): 10 mg via INTRAVENOUS
  Filled 2023-12-01: qty 2

## 2023-12-01 MED ORDER — DEXAMETHASONE SODIUM PHOSPHATE 10 MG/ML IJ SOLN
10.0000 mg | Freq: Once | INTRAMUSCULAR | Status: AC
  Administered 2023-12-01 (×2): 10 mg via INTRAVENOUS
  Filled 2023-12-01: qty 1

## 2023-12-01 NOTE — Discharge Instructions (Addendum)
 You were seen today for headache.  With it having been helped with medications today, still recommend you follow-up with PCP today to message and see if they can additionally put any new medications on board to help you while you are waiting for neurology follow-up.  Please return to the ED for any new or worsening symptoms.  Your urine analysis today did not show any worrisome signs of UTI.  Return to the ED though if you were to have any new or worsening symptoms.

## 2023-12-01 NOTE — ED Provider Notes (Signed)
  Physical Exam  BP 117/74 (BP Location: Right Arm)   Pulse 81   Temp 98 F (36.7 C) (Oral)   Resp 18   Ht 5' 4 (1.626 m)   Wt 68 kg   LMP 11/10/2023 (Exact Date)   SpO2 100%   BMI 25.75 kg/m   Physical Exam Vitals and nursing note reviewed.  Constitutional:      General: She is not in acute distress.    Appearance: She is well-developed. She is not ill-appearing or diaphoretic.  Pulmonary:     Effort: Pulmonary effort is normal. No respiratory distress.  Musculoskeletal:     Cervical back: Normal range of motion. No rigidity.  Neurological:     Mental Status: She is alert and oriented to person, place, and time. Mental status is at baseline.     GCS: GCS eye subscore is 4. GCS verbal subscore is 5. GCS motor subscore is 6.  Psychiatric:        Mood and Affect: Mood normal.        Speech: Speech normal.        Behavior: Behavior normal.     Procedures  Procedures  ED Course / MDM   Clinical Course as of 12/01/23 2252  Wed Dec 01, 2023  2133 6th visit for headache in last 2 weeks. Had bacteria in urine with UA pending. Declined inpatient neurology consult.  [CB]  2134 Patient has declined consideration for admission. She will get migraine cocktail. She also requested a recheck of UA due to prior sample having bacteria. Not havind dysuria. She was advised to go to [facility] if she feels like she needs to be seen in the ED again. Care will be signed out at the change of shift pending reassessment of headache.  [CS]    Clinical Course User Index [CB] Mariesha Venturella S, PA-C [CS] Roselyn Carlin NOVAK, MD   Medical Decision Making Risk Prescription drug management.   Patient care was transferred over from Dr. Roselyn.  At time of handoff, awaiting UA due to patient's concern for UTI as well as getting migraine cocktail.  UA did not show any signs of UTI.  After reevaluation, patient reported that she had been feeling significantly better.  Told her to follow-up with PCP in the  interim if she is wishing to have any other additional rest medications before she sees neurology.  Patient vital signs have remained stable throughout the course of patient's time in the ED. Low suspicion for any other emergent pathology at this time. I believe this patient is safe to be discharged. Provided strict return to ER precautions. Patient expressed agreement and understanding of plan. All questions were answered.       Derell Bruun S, PA-C 12/01/23 2253    Patsey Lot, MD 12/01/23 2322

## 2023-12-01 NOTE — ED Triage Notes (Signed)
 Pt via POV c/o recurrent headaches, most recently ongoing x 2 weeks. Pain described as constant and throbbing, rated 10/10. Pt reports floaters in her vision and occasional brain fog.

## 2023-12-01 NOTE — ED Provider Notes (Signed)
 [place] EMERGENCY DEPARTMENT AT [facility]  Provider Note  CSN: [id number] Arrival date & time: 12/01/23 2042  History Chief Complaint  Patient presents with   Headache    Kathleen Swanson is a 28 y.o. female with history of migraines, has been taking fioricet at home. Briefly tried topamax but did not tolerate side effects. She has had more or less consistent headache for the last 2 weeks including 5 prior ED visits. She feels better at discharge but symptoms return quickly. She is scheduled to see Neurology in September. She has had ED workup including labs and CT head in the last 2 weeks which have been reassuring.    Home Medications Prior to Admission medications   Medication Sig Start Date End Date Taking? Authorizing Provider  butalbital -acetaminophen -caffeine  (FIORICET) 50-325-40 MG tablet Take by mouth. 03/26/22   [provider]  cephALEXin  (KEFLEX ) 500 MG capsule Take 1 capsule (500 mg total) by mouth 4 (four) times daily. 11/25/23   [name], PA  cetirizine  (ZYRTEC  ALLERGY) 10 MG tablet Take 1 tablet (10 mg total) by mouth daily. 02/17/20   [name], PA-C  Cyanocobalamin (B-12 PO) Take by mouth.    [provider]  Ferrous Sulfate (IRON PO) Take by mouth.    [provider]  hydrOXYzine  (VISTARIL ) 25 MG capsule Take 25 mg by mouth every 8 (eight) hours as needed for anxiety. 08/19/22   [provider]  hydrOXYzine  (VISTARIL ) 50 MG capsule Take 50 mg by mouth at bedtime.    [provider]  ondansetron  (ZOFRAN ) 4 MG tablet Take 1 tablet (4 mg total) by mouth every 8 (eight) hours as needed for nausea or vomiting. Patient not taking: Reported on 04/07/2023 09/26/19   Knapp, Iva, MD  ondansetron  (ZOFRAN -ODT) 4 MG disintegrating tablet Take 1 tablet (4 mg total) by mouth every 8 (eight) hours as needed. 11/29/23   [name], PA  oxyCODONE  (ROXICODONE ) 5 MG immediate release tablet Take 1 tablet (5 mg total)  by mouth every 6 (six) hours as needed for severe pain. 09/04/22   [name], MD  pantoprazole (PROTONIX) 40 MG tablet Take 40 mg by mouth every morning. 11/22/23   [provider]  rizatriptan  (MAXALT ) 10 MG tablet Take 1 tablet (10 mg total) by mouth as needed for migraine. May repeat in 2 hours if needed.  Do not exceed 2 doses in 24 hours. 11/29/23   [name], PA  sucralfate (CARAFATE) 1 g tablet Take 1 g by mouth 4 (four) times daily. 11/22/23 12/06/23  [provider]  topiramate (TOPAMAX) 25 MG tablet Take 25 mg by mouth at bedtime. 11/26/23 12/03/23  [provider]  valACYclovir (VALTREX) 500 MG tablet Take 500 mg by mouth daily.    [provider]     Allergies    Metronidazole   Review of Systems   Review of Systems Please see HPI for pertinent positives and negatives  Physical Exam BP (!) 132/101 (BP Location: Right Arm)   Pulse 92   Temp 97.6 F (36.4 C)   Resp 19   Ht 5' 4 (1.626 m)   Wt 68 kg   LMP 11/10/2023 (Exact Date)   SpO2 100%   BMI 25.75 kg/m   Physical Exam Vitals and nursing note reviewed.  HENT:     Head: Normocephalic.     Nose: Nose normal.  Eyes:     Extraocular Movements: Extraocular movements intact.  Pulmonary:  Effort: Pulmonary effort is normal.  Musculoskeletal:        General: Normal range of motion.     Cervical back: Neck supple.  Skin:    Findings: No rash (on exposed skin).  Neurological:     General: No focal deficit present.     Mental Status: She is alert and oriented to person, place, and time.  Psychiatric:        Mood and Affect: Mood normal.     ED Results / Procedures / Treatments   EKG None  Procedures Procedures  Medications Ordered in the ED Medications  ketorolac  (TORADOL ) 30 MG/ML injection 30 mg (has no administration in time range)  prochlorperazine  (COMPAZINE ) injection 10 mg (has no administration in time range)  dexamethasone  (DECADRON ) injection 10 mg  (has no administration in time range)    Initial Impression and Plan  Patient here with persistent chronic headaches. Advised no further ED workup would be helpful, but offered to discuss with Neuro whether admission for status migrainous would be beneficial. She is unsure about staying due to childcare needs at home, but will discuss with her mother-in-law who is at bedside. In the meantime, will give Toradol , Compazine  and decadron  as this has helped her symptoms in recent visits.   ED Course   Clinical Course as of 12/01/23 2136  Wed Dec 01, 2023  2133 6th visit for headache in last 2 weeks. Had bacteria in urine with UA pending. Declined inpatient neurology consult.  [CB]  2134 Patient has declined consideration for admission. She will get migraine cocktail. She also requested a recheck of UA due to prior sample having bacteria. Not havind dysuria. She was advised to go to [facility] if she feels like she needs to be seen in the ED again. Care will be signed out at the change of shift pending reassessment of headache.  [CS]    Clinical Course User Index [CB] Beola Terrall RAMAN, PA-C [CS] Roselyn Carlin NOVAK, MD     MDM Rules/Calculators/A&P Medical Decision Making Problems Addressed: Acute intractable headache, unspecified headache type: acute illness or injury  Amount and/or Complexity of Data Reviewed Labs: ordered. Decision-making details documented in ED Course.  Risk Prescription drug management.     Final Clinical Impression(s) / ED Diagnoses Final diagnoses:  Acute intractable headache, unspecified headache type    Rx / DC Orders ED Discharge Orders     None        Roselyn Carlin NOVAK, MD 12/01/23 2136

## 2023-12-06 ENCOUNTER — Encounter: Payer: Self-pay | Admitting: Cardiology

## 2023-12-06 ENCOUNTER — Ambulatory Visit: Payer: MEDICAID

## 2023-12-06 ENCOUNTER — Ambulatory Visit: Payer: MEDICAID | Attending: Cardiology | Admitting: Cardiology

## 2023-12-06 VITALS — BP 110/60 | HR 74 | Ht 64.0 in | Wt 146.0 lb

## 2023-12-06 DIAGNOSIS — I441 Atrioventricular block, second degree: Secondary | ICD-10-CM

## 2023-12-06 DIAGNOSIS — Z87898 Personal history of other specified conditions: Secondary | ICD-10-CM

## 2023-12-06 DIAGNOSIS — K219 Gastro-esophageal reflux disease without esophagitis: Secondary | ICD-10-CM | POA: Diagnosis not present

## 2023-12-06 DIAGNOSIS — G43809 Other migraine, not intractable, without status migrainosus: Secondary | ICD-10-CM

## 2023-12-06 DIAGNOSIS — F909 Attention-deficit hyperactivity disorder, unspecified type: Secondary | ICD-10-CM | POA: Insufficient documentation

## 2023-12-06 DIAGNOSIS — R001 Bradycardia, unspecified: Secondary | ICD-10-CM

## 2023-12-06 NOTE — Progress Notes (Signed)
 Cardiology Consultation:    Date:  12/06/2023   ID:  Kathleen Swanson, DOB August 06, 1995, MRN [id number]  PCP:  Anita Bernardino BROCKS, FNP  Cardiologist:  Lamar Fitch, MD   Referring MD: Anita Bernardino BROCKS, FNP   Chief Complaint  Patient presents with   Bradycardia    History of Present Illness:    Kathleen Swanson is a 28 y.o. female who is being seen today for the evaluation of second-degree type I AV block, bradycardia at the request of Anita Bernardino BROCKS, FNP.  Medical history significant for migraine headaches, ADHD, anxiety.  She came to my office because 2 years ago she was diagnosed with second-degree type I AV block.  She did wear a monitor for 2 weeks and then what she was told to have she told her not to be worried but she is very concerned about it she also noticed sometimes that her heart rate slows down to 40 8 at night.  She is concerned about it.  Denies have any chest pain tightness squeezing pressure burning chest.  She struggled from migraine headache which evaluated her a lot, she said she been having headache for last few weeks straight.  Does not smoke have 3 children she is in the room with her husband who participate in decision-making.  He tells me that she does not exercise on the regular basis does not have family history  Past Medical History:  Diagnosis Date   ADHD (attention deficit hyperactivity disorder) 12/06/2023   Anemia 07/08/2013   06/27/13 - discussed the importance of follow through.  She denies any heavy menses and states that she feel well.  I have told her that I will refer her for further evaluation of this anemia.    03/20/14 - notes increased heaviness with her menses.     Anxiety    Anxiety state 02/10/2011   02/02/11 - seen in the ED and evaluated.  Lab work up neg.  States that she is very stressed over the situation in her family and interactions with certain family members.   03/06/11 - seen in the ed on 03/04/11 - started on valium 5mg  - prn anxiety.     04/07/11 - doing well - in counseling - has not taken any valium recently.  Has been seen by dr. Alexandra and will start on 12.5mg  daily.  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  No nausea or vomiting.  04/07/11 - minimal GERD  05/25/12 - stable - gerd.  11/17/12 - brief flare - 2 months ago - lasted briefly - responded to water - triggered by diet.  06/27/13 - no complaints - doing well     History of palpitations 08/06/2023   Migraine    Migraine without status migrainosus, not intractable 08/06/2023    Past Surgical History:  Procedure Laterality Date   ADENOIDECTOMY     BREAST LUMPECTOMY Right 09/04/2022   Procedure: RIGHT BREAST LUMPECTOMY;  Surgeon: Curvin Deward MOULD, MD;  Location: [facility];  Service: General;  Laterality: Right;   CESAREAN SECTION     TUBAL LIGATION     WISDOM TOOTH EXTRACTION      Current Medications: Current Meds  Medication Sig   Cyanocobalamin (B-12 PO) Take by mouth.   Ferrous Sulfate (IRON PO) Take by  mouth.   hydrOXYzine  (VISTARIL ) 25 MG capsule Take 25 mg by mouth every 8 (eight) hours as needed for anxiety.   Magnesium  200 MG TABS Take 200 mg by mouth daily.   ondansetron  (ZOFRAN -ODT) 4 MG disintegrating tablet Take 1 tablet (4 mg total) by mouth every 8 (eight) hours as needed.   sucralfate (CARAFATE) 1 g tablet Take 1 g by mouth 4 (four) times daily.   valACYclovir (VALTREX) 500 MG tablet Take 500 mg by mouth daily as needed (outbreaks).     Allergies:   Metronidazole and Meloxicam   Social History   Socioeconomic History   Marital status: Divorced    Spouse name: Not on file   Number of children: Not on file   Years of education: Not on file   Highest education level: Not on file  Occupational History   Not on file  Tobacco Use    Smoking status: Never    Passive exposure: Never   Smokeless tobacco: Never  Vaping Use   Vaping status: Never Used  Substance and Sexual Activity   Alcohol use: No   Drug use: No   Sexual activity: Yes    Partners: Male    Birth control/protection: Surgical    Comment: tubal, menarche 28yo, sexual debut 28yo  Other Topics Concern   Not on file  Social History Narrative   Not on file   Social Drivers of Health   Financial Resource Strain: Low Risk  (06/04/2020)   Received from [facility]   Overall Financial Resource Strain (CARDIA)    Difficulty of Paying Living Expenses: Not hard at all  Food Insecurity: Low Risk  (11/22/2023)   Received from [facility]   Hunger Vital Sign    Within the past 12 months, you worried that your food would run out before you got money to buy more: Never true    Within the past 12 months, the food you bought just didn't last and you didn't have money to get more. : Never true  Transportation Needs: No Transportation Needs (11/22/2023)   Received from [facility]    In the past 12 months, has lack of reliable transportation kept you from medical appointments, meetings, work or from getting things needed for daily living? : No  Physical Activity: Sufficiently Active (06/04/2020)   Received from [facility]   Exercise Vital Sign    On average, how many days per week do you engage in moderate to strenuous exercise (like a brisk walk)?: 5 days    On average, how many minutes do you engage in exercise at this level?: 30 min  Stress: No Stress Concern Present (06/04/2020)   Received from [facility]   [employer] of Occupational Health - Occupational Stress Questionnaire    Feeling of Stress : Only a little  Social Connections: Unknown (09/01/2021)   Received from [facility]   Social Network    Social Network: Not on file     Family History: The patient's  family history includes Breast cancer in her maternal grandmother; Fibromyalgia in her mother; Hyperlipidemia in her mother; Hypertension in her mother; Multiple sclerosis in her sister. ROS:   Please see the history of present illness.    All 14 point review of systems negative except as described per history of present illness.  EKGs/Labs/Other Studies Reviewed:    The following studies were reviewed today:   EKG:  EKG Interpretation  Date/Time:  Monday December 06 2023 13:46:01 EDT Ventricular Rate:  74 PR Interval:  154 QRS Duration:  88 QT Interval:  378 QTC Calculation: 419 R Axis:   46  Text Interpretation: Normal sinus rhythm with sinus arrhythmia Normal ECG When compared with ECG of 30-Nov-2023 08:52, PREVIOUS ECG IS PRESENT Confirmed by Bernie Charleston [phone]) on 12/06/2023 3:31:31 PM    Recent Labs: 04/07/2023: ALT 8; TSH 1.160 11/30/2023: BUN 7; Creatinine, Ser 0.83; Hemoglobin 13.5; Platelets 462; Potassium 3.4; Sodium 138  Recent Lipid Panel No results found for: CHOL, TRIG, HDL, CHOLHDL, VLDL, LDLCALC, LDLDIRECT  Physical Exam:    VS:  BP 110/60   Pulse 74   Ht 5' 4 (1.626 m)   Wt 146 lb (66.2 kg)   LMP 11/10/2023 (Exact Date)   SpO2 97%   BMI 25.06 kg/m     Wt Readings from Last 3 Encounters:  12/06/23 146 lb (66.2 kg)  12/01/23 150 lb (68 kg)  11/29/23 150 lb (68 kg)     GEN:  Well nourished, well developed in no acute distress HEENT: Normal NECK: No JVD; No carotid bruits LYMPHATICS: No lymphadenopathy CARDIAC: RRR, no murmurs, no rubs, no gallops RESPIRATORY:  Clear to auscultation without rales, wheezing or rhonchi  ABDOMEN: Soft, non-tender, non-distended MUSCULOSKELETAL:  No edema; No deformity  SKIN: Warm and dry NEUROLOGIC:  Alert and oriented x 3 PSYCHIATRIC:  Normal affect   ASSESSMENT:    1. Bradycardia   2. Second degree AV block, Mobitz type I   3. Gastroesophageal reflux disease, unspecified whether esophagitis  present   4. Other migraine without status migrainosus, not intractable   5. History of palpitations    PLAN:    In order of problems listed above:  History of second-degree type I AV block we spent great of time talking about this I told her also that usually it is not a significant finding especially if happens at night.  She did have a copy of this recording on her phone and actually happened at 8:45 in the morning.  She cannot remember if she was asleep during the time or not.  At the same time her husband tell me that she does not snore.  I will ask her to wear another Zio patch to see if she get any worsening of AV conduction problem on top of that there is a history of having bradycardia Zio patch will help to answer this question as well. History of palpitations denies having any. Migraine headache.  Noted.  Followed by antimedicine team she is also scheduled to see neurology   Medication Adjustments/Labs and Tests Ordered: Current medicines are reviewed at length with the patient today.  Concerns regarding medicines are outlined above.  Orders Placed This Encounter  Procedures   LONG TERM MONITOR (3-14 DAYS)   EKG 12-Lead   ECHOCARDIOGRAM COMPLETE   No orders of the defined types were placed in this encounter.   Signed, [place] DOROTHA Bernie, MD, [facility]. 12/06/2023 3:32 PM    [facility]

## 2023-12-06 NOTE — Patient Instructions (Addendum)
 Medication Instructions:  Your physician recommends that you continue on your current medications as directed. Please refer to the Current Medication list given to you today.  *If you need a refill on your cardiac medications before your next appointment, please call your pharmacy*   Lab Work: None Ordered If you have labs (blood work) drawn today and your tests are completely normal, you will receive your results only by: MyChart Message (if you have MyChart) OR A paper copy in the mail If you have any lab test that is abnormal or we need to change your treatment, we will call you to review the results.   Testing/Procedures: Your physician has requested that you have an echocardiogram. Echocardiography is a painless test that uses sound waves to create images of your heart. It provides your doctor with information about the size and shape of your heart and how well your heart's chambers and valves are working. This procedure takes approximately one hour. There are no restrictions for this procedure. Please do NOT wear cologne, perfume, aftershave, or lotions (deodorant is allowed). Please arrive 15 minutes prior to your appointment time.  Please note: We ask at that you not bring children with you during ultrasound (echo/ vascular) testing. Due to room size and safety concerns, children are not allowed in the ultrasound rooms during exams. Our front office staff cannot provide observation of children in our lobby area while testing is being conducted. An adult accompanying a patient to their appointment will only be allowed in the ultrasound room at the discretion of the ultrasound technician under special circumstances. We apologize for any inconvenience.    WHY IS MY DOCTOR PRESCRIBING ZIO? The Zio system is proven and trusted by physicians to detect and diagnose irregular heart rhythms -- and has been prescribed to hundreds of thousands of patients.  The FDA has cleared the Zio system to  monitor for many different kinds of irregular heart rhythms. In a study, physicians were able to reach a diagnosis 90% of the time with the Zio system1.  You can wear the Zio monitor -- a small, discreet, comfortable patch -- during your normal day-to-day activity, including while you sleep, shower, and exercise, while it records every single heartbeat for analysis.  1Barrett, P., et al. Comparison of 24 Hour Holter Monitoring Versus 14 Day Novel Adhesive Patch Electrocardiographic Monitoring. American Journal of Medicine, 2014.  ZIO VS. HOLTER MONITORING The Zio monitor can be comfortably worn for up to 14 days. Holter monitors can be worn for 24 to 48 hours, limiting the time to record any irregular heart rhythms you may have. Zio is able to capture data for the 51% of patients who have their first symptom-triggered arrhythmia after 48 hours.1  LIVE WITHOUT RESTRICTIONS The Zio ambulatory cardiac monitor is a small, unobtrusive, and water-resistant patch--you might even forget you're wearing it. The Zio monitor records and stores every beat of your heart, whether you're sleeping, working out, or showering.     Follow-Up: At [facility], you and your health needs are our priority.  As part of our continuing mission to provide you with exceptional heart care, we have created designated Provider Care Teams.  These Care Teams include your primary Cardiologist (physician) and Advanced Practice Providers (APPs -  Physician Assistants and Nurse Practitioners) who all work together to provide you with the care you need, when you need it.  We recommend signing up for the patient portal called MyChart.  Sign up information is provided on this  After Visit Summary.  MyChart is used to connect with patients for Virtual Visits (Telemedicine).  Patients are able to view lab/test results, encounter notes, upcoming appointments, etc.  Non-urgent messages can be sent to your provider as well.   To learn more  about what you can do with MyChart, go to [email or web address].    Your next appointment:  As needed  The format for your next appointment:   In Person  Provider:   Lamar Fitch, MD    Other Instructions NA

## 2023-12-13 ENCOUNTER — Ambulatory Visit (HOSPITAL_COMMUNITY)
Admission: RE | Admit: 2023-12-13 | Discharge: 2023-12-13 | Disposition: A | Discharge status: Home / Self Care | Payer: MEDICAID | Source: Ambulatory Visit | Attending: Cardiology | Admitting: Cardiology

## 2023-12-13 DIAGNOSIS — R001 Bradycardia, unspecified: Secondary | ICD-10-CM | POA: Insufficient documentation

## 2023-12-13 LAB — ECHOCARDIOGRAM COMPLETE
AR max vel: 3.58 cm2
AV Area VTI: 3.45 cm2
AV Area mean vel: 3.33 cm2
AV Mean grad: 2 mmHg
AV Peak grad: 4.4 mmHg
Ao pk vel: 1.05 m/s
Area-P 1/2: 3.06 cm2
S' Lateral: 2.6 cm

## 2023-12-14 ENCOUNTER — Telehealth (HOSPITAL_BASED_OUTPATIENT_CLINIC_OR_DEPARTMENT_OTHER): Payer: Self-pay | Admitting: Emergency Medicine

## 2023-12-14 ENCOUNTER — Other Ambulatory Visit: Payer: Self-pay

## 2023-12-14 ENCOUNTER — Emergency Department (HOSPITAL_BASED_OUTPATIENT_CLINIC_OR_DEPARTMENT_OTHER)
Admission: EM | Admit: 2023-12-14 | Discharge: 2023-12-14 | Disposition: A | Discharge status: Home / Self Care | Payer: MEDICAID | Attending: Emergency Medicine | Admitting: Emergency Medicine

## 2023-12-14 ENCOUNTER — Encounter (HOSPITAL_BASED_OUTPATIENT_CLINIC_OR_DEPARTMENT_OTHER): Payer: Self-pay | Admitting: Emergency Medicine

## 2023-12-14 DIAGNOSIS — R519 Headache, unspecified: Secondary | ICD-10-CM | POA: Diagnosis not present

## 2023-12-14 DIAGNOSIS — I441 Atrioventricular block, second degree: Secondary | ICD-10-CM | POA: Diagnosis not present

## 2023-12-14 DIAGNOSIS — R002 Palpitations: Secondary | ICD-10-CM | POA: Diagnosis present

## 2023-12-14 DIAGNOSIS — I471 Supraventricular tachycardia, unspecified: Secondary | ICD-10-CM | POA: Diagnosis not present

## 2023-12-14 LAB — BASIC METABOLIC PANEL WITH GFR
Anion gap: 17 — ABNORMAL HIGH (ref 5–15)
BUN: 9 mg/dL (ref 6–20)
CO2: 19 mmol/L — ABNORMAL LOW (ref 22–32)
Calcium: 10.2 mg/dL (ref 8.9–10.3)
Chloride: 100 mmol/L (ref 98–111)
Creatinine, Ser: 0.77 mg/dL (ref 0.44–1.00)
GFR, Estimated: >60 mL/min (ref >60)
Glucose, Bld: 74 mg/dL (ref 70–99)
Potassium: 3.4 mmol/L — ABNORMAL LOW (ref 3.5–5.1)
Sodium: 136 mmol/L (ref 135–145)

## 2023-12-14 LAB — CBC WITH DIFFERENTIAL/PLATELET
Abs Immature Granulocytes: 0.02 K/uL (ref 0.00–0.07)
Basophils Absolute: 0 K/uL (ref 0.0–0.1)
Basophils Relative: 0 %
Eosinophils Absolute: 0.1 K/uL (ref 0.0–0.5)
Eosinophils Relative: 1 %
HCT: 37.5 % (ref 36.0–46.0)
Hemoglobin: 12.6 g/dL (ref 12.0–15.0)
Immature Granulocytes: 0 %
Lymphocytes Relative: 17 %
Lymphs Abs: 1.3 K/uL (ref 0.7–4.0)
MCH: 28.9 pg (ref 26.0–34.0)
MCHC: 33.6 g/dL (ref 30.0–36.0)
MCV: 86 fL (ref 80.0–100.0)
Monocytes Absolute: 0.4 K/uL (ref 0.1–1.0)
Monocytes Relative: 6 %
Neutro Abs: 5.5 K/uL (ref 1.7–7.7)
Neutrophils Relative %: 76 %
Platelets: 410 K/uL — ABNORMAL HIGH (ref 150–400)
RBC: 4.36 MIL/uL (ref 3.87–5.11)
RDW: 13.1 % (ref 11.5–15.5)
WBC: 7.3 K/uL (ref 4.0–10.5)
nRBC: 0 % (ref 0.0–0.2)

## 2023-12-14 LAB — MAGNESIUM: Magnesium: 2 mg/dL (ref 1.7–2.4)

## 2023-12-14 MED ORDER — LORAZEPAM 1 MG PO TABS: 1.0000 mg | ORAL_TABLET | Freq: Three times a day (TID) | ORAL | 0 refills | Status: DC | PRN

## 2023-12-14 MED ORDER — LORAZEPAM 1 MG PO TABS
1.0000 mg | ORAL_TABLET | Freq: Once | ORAL | Status: AC
  Administered 2023-12-14: 1 mg via ORAL
  Filled 2023-12-14: qty 1

## 2023-12-14 NOTE — ED Provider Notes (Signed)
 [name] Provider Note   CSN: [id number] Arrival date & time: 12/14/23  1052     Patient presents with: Headache   Kathleen Swanson is a 28 y.o. female.   Patient with history of second-degree type I heart block, migraine headaches, anxiety --presents to the emergency department today for evaluation of headache and elevated heart rate.  Of note, patient was seen by cardiology about a week ago for bradycardia.  She was prescribed a Zio patch at that time which she has had on for the past week.  An echocardiogram was ordered which she had yesterday and was normal.  The patient reports recurrent and near constant headaches over the past several weeks.  CT scan earlier in the month was negative.  She is scheduled to follow-up with neurology in about 2 weeks.  She reports ongoing headache, but unchanged.  Patient is very concerned today that when she stands up her heart rate goes up as high as 120.  She has not had any syncope or lightheadedness with this.  She reports drinking plenty of fluids including Pedialyte.  No diarrhea.  Headache is generalized, also has pain in the front of her neck.  She is concerned because she has never had an issue with elevated heart rate prior to having these headaches.  She also voices concern and frustration over having to wait so long to see neurology.  No recent fevers.  No associated chest pains or shortness of breath.  No vomiting or diaphoresis.       Prior to Admission medications   Medication Sig Start Date End Date Taking? Authorizing Provider  Cyanocobalamin (B-12 PO) Take by mouth.    [provider]  Ferrous Sulfate (IRON PO) Take by mouth.    [provider]  hydrOXYzine  (VISTARIL ) 25 MG capsule Take 25 mg by mouth every 8 (eight) hours as needed for anxiety. 08/19/22   [provider]  Magnesium  200 MG TABS Take 200 mg by mouth daily.    [provider]  ondansetron   (ZOFRAN -ODT) 4 MG disintegrating tablet Take 1 tablet (4 mg total) by mouth every 8 (eight) hours as needed. 11/29/23   [name], PA  sucralfate (CARAFATE) 1 g tablet Take 1 g by mouth 4 (four) times daily. 11/22/23 12/06/23  [provider]  valACYclovir (VALTREX) 500 MG tablet Take 500 mg by mouth daily as needed (outbreaks).    [provider]    Allergies: Metronidazole and Meloxicam    Review of Systems  Updated Vital Signs BP 115/80 (BP Location: Right Arm)   Pulse 92   Temp 98 F (36.7 C) (Oral)   Resp 16   LMP 11/10/2023 (Exact Date)   SpO2 100%   Physical Exam Vitals and nursing note reviewed.  Constitutional:      Appearance: She is well-developed. She is not diaphoretic.     Comments: Tearful when talking about her symptoms.  HENT:     Head: Normocephalic and atraumatic.     Mouth/Throat:     Mouth: Mucous membranes are not dry.  Eyes:     Conjunctiva/sclera: Conjunctivae normal.  Neck:     Vascular: Normal carotid pulses. No JVD.     Trachea: Trachea normal. No tracheal deviation.  Cardiovascular:     Rate and Rhythm: Normal rate and regular rhythm.     Pulses: No decreased pulses.          Radial pulses are 2+ on the  right side and 2+ on the left side.     Heart sounds: Normal heart sounds, S1 normal and S2 normal. No murmur heard.    Comments: Normal rate, regular rhythm, no murmurs. Pulmonary:     Effort: Pulmonary effort is normal. No respiratory distress.     Breath sounds: No wheezing.  Chest:     Chest wall: No tenderness.  Abdominal:     General: Bowel sounds are normal.     Palpations: Abdomen is soft.     Tenderness: There is no abdominal tenderness. There is no guarding or rebound.  Musculoskeletal:        General: Normal range of motion.     Cervical back: Normal range of motion and neck supple. No muscular tenderness.  Skin:    General: Skin is warm and dry.     Coloration: Skin is not pale.  Neurological:      Mental Status: She is alert.  Psychiatric:        Mood and Affect: Mood is anxious.     (all labs ordered are listed, but only abnormal results are displayed) Labs Reviewed  CBC WITH DIFFERENTIAL/PLATELET - Abnormal; Notable for the following components:      Result Value   Platelets 410 (*)    All other components within normal limits  BASIC METABOLIC PANEL WITH GFR - Abnormal; Notable for the following components:   Potassium 3.4 (*)    CO2 19 (*)    Anion gap 17 (*)    All other components within normal limits  MAGNESIUM     ED ECG REPORT   Date: 12/14/2023  Rate: 69  Rhythm: normal sinus rhythm  QRS Axis: normal  Intervals: normal  ST/T Wave abnormalities: normal  Conduction Disutrbances:none  Narrative Interpretation:   Old EKG Reviewed: unchanged  I have personally reviewed the EKG tracing and agree with the computerized printout as noted.   Radiology: ECHOCARDIOGRAM COMPLETE Result Date: 12/13/2023    ECHOCARDIOGRAM REPORT   Patient Name:   Kathleen Swanson Date of Exam: 12/13/2023 Medical Rec #:  [id number]     Height:       64.0 in Accession #:    [id number]    Weight:       146.0 lb Date of Birth:  1995-05-19      BSA:          1.711 m Patient Age:    28 years      BP:           135/85 mmHg Patient Gender: F             HR:           63 bpm. Exam Location:  [address] Procedure: 2D Echo, Cardiac Doppler and Color Doppler (Both Spectral and Color            Flow Doppler were utilized during procedure). Indications:    Bradycardia R00.1  History:        Patient has no prior history of Echocardiogram examinations.                 Arrythmias:Bradycardia and Palpitations. Migraines.  Sonographer:    Sharlet Hamilton RDCS Referring Phys: LAMAR JINNY FITCH IMPRESSIONS  1. Left ventricular ejection fraction, by estimation, is 60 to 65%. The left ventricle has normal function. The left ventricle has no regional wall motion abnormalities. Left ventricular diastolic parameters were  normal.  2. Right ventricular systolic function is normal. The right ventricular size  is normal. Tricuspid regurgitation signal is inadequate for assessing PA pressure.  3. The mitral valve is normal in structure. Trivial mitral valve regurgitation. No evidence of mitral stenosis.  4. The aortic valve is tricuspid. Aortic valve regurgitation is not visualized. No aortic stenosis is present.  5. The inferior vena cava is normal in size with greater than 50% respiratory variability, suggesting right atrial pressure of 3 mmHg. FINDINGS  Left Ventricle: Left ventricular ejection fraction, by estimation, is 60 to 65%. The left ventricle has normal function. The left ventricle has no regional wall motion abnormalities. The left ventricular internal cavity size was normal in size. There is  no left ventricular hypertrophy. Left ventricular diastolic parameters were normal. Right Ventricle: The right ventricular size is normal. No increase in right ventricular wall thickness. Right ventricular systolic function is normal. Tricuspid regurgitation signal is inadequate for assessing PA pressure. Left Atrium: Left atrial size was normal in size. Right Atrium: Right atrial size was normal in size. Pericardium: There is no evidence of pericardial effusion. Mitral Valve: The mitral valve is normal in structure. Trivial mitral valve regurgitation. No evidence of mitral valve stenosis. Tricuspid Valve: The tricuspid valve is normal in structure. Tricuspid valve regurgitation is trivial. Aortic Valve: The aortic valve is tricuspid. Aortic valve regurgitation is not visualized. No aortic stenosis is present. Aortic valve mean gradient measures 2.0 mmHg. Aortic valve peak gradient measures 4.4 mmHg. Aortic valve area, by VTI measures 3.45 cm. Pulmonic Valve: The pulmonic valve was not well visualized. Pulmonic valve regurgitation is not visualized. Aorta: The aortic root and ascending aorta are structurally normal, with no evidence of  dilitation. Venous: The inferior vena cava is normal in size with greater than 50% respiratory variability, suggesting right atrial pressure of 3 mmHg. IAS/Shunts: The interatrial septum was not well visualized.  LEFT VENTRICLE PLAX 2D LVIDd:         4.10 cm   Diastology LVIDs:         2.60 cm   LV e' medial:    16.60 cm/s LV PW:         0.80 cm   LV E/e' medial:  4.5 LV IVS:        0.80 cm   LV e' lateral:   16.30 cm/s LVOT diam:     2.20 cm   LV E/e' lateral: 4.6 LV SV:         74 LV SV Index:   43 LVOT Area:     3.80 cm  RIGHT VENTRICLE             IVC RV Basal diam:  2.80 cm     IVC diam: 1.50 cm RV Mid diam:    3.60 cm RV S prime:     16.20 cm/s TAPSE (M-mode): 2.3 cm LEFT ATRIUM           Index        RIGHT ATRIUM           Index LA diam:      2.90 cm 1.69 cm/m   RA Pressure: 3.00 mmHg LA Vol (A2C): 48.0 ml 28.05 ml/m  RA Area:     11.70 cm LA Vol (A4C): 26.8 ml 15.66 ml/m  RA Volume:   23.20 ml  13.56 ml/m  AORTIC VALVE AV Area (Vmax):    3.58 cm AV Area (Vmean):   3.33 cm AV Area (VTI):     3.45 cm AV Vmax:  105.00 cm/s AV Vmean:          71.200 cm/s AV VTI:            0.214 m AV Peak Grad:      4.4 mmHg AV Mean Grad:      2.0 mmHg LVOT Vmax:         99.00 cm/s LVOT Vmean:        62.400 cm/s LVOT VTI:          0.194 m LVOT/AV VTI ratio: 0.91  AORTA Ao Root diam: 2.60 cm Ao Asc diam:  2.60 cm MITRAL VALVE               TRICUSPID VALVE MV Area (PHT): 3.06 cm    Estimated RAP:  3.00 mmHg MV Decel Time: 248 msec MV E velocity: 74.40 cm/s  SHUNTS MV A velocity: 42.40 cm/s  Systemic VTI:  0.19 m MV E/A ratio:  1.75        Systemic Diam: 2.20 cm Lonni Nanas MD Electronically signed by Lonni Nanas MD Signature Date/Time: 12/13/2023/11:15:11 AM    Final      Procedures   Medications Ordered in the ED  LORazepam  (ATIVAN ) tablet 1 mg (1 mg Oral Given 12/14/23 1329)    ED Course  Patient seen and examined. History obtained directly from patient.   Labs/EKG: EKG  ordered  Imaging: None ordered  Medications/Fluids: None ordered  Most recent vital signs reviewed and are as follows: BP 115/80 (BP Location: Right Arm)   Pulse 92   Temp 98 F (36.7 C) (Oral)   Resp 16   LMP 11/10/2023 (Exact Date)   SpO2 100%   Initial impression: I did discussion with the patient regarding her symptoms.  I agree with her current ongoing workup.  In regards to her tachycardia, which is related to position change, offered IV fluids, check of electrolytes, EKG, orthostatics.  In regards to ongoing headache, offered migraine cocktail, IV fluids.  Patient and family member at bedside are here today more for answers as to why her heart rate is going so high when she stands.  Discussed that while concerning, as long as her rhythm appears to be in normal sinus rhythm, would continue with outpatient workup as this is unlikely to be a dangerous problem.  She declines fluids and labs as well as migraine cocktail currently.  She is agreeable to a check of orthostatic vital signs and EKG.  I apologized that it is taking so long for her to get to see a neurologist but encouraged her to keep her appointment in 2 weeks.  12:34 PM Reassessment performed. Patient appears stable, comfortable.  We had a good discussion in regards to questions about her echocardiogram yesterday.  Orthostatic VS for the past 24 hrs:  BP- Lying Pulse- Lying BP- Sitting Pulse- Sitting BP- Standing at 0 minutes Pulse- Standing at 0 minutes  12/14/23 1215 114/70 67 126/75 72 (!) 143/95 79    Labs personally reviewed and interpreted including: EKG reviewed as above.  No changes from earlier in August.  Reviewed pertinent results with patient at bedside. Questions answered.  She is still agreeable to forego additional treatment for headache at this time.  Most current vital signs reviewed and are as follows: BP 115/80 (BP Location: Right Arm)   Pulse 92   Temp 98 F (36.7 C) (Oral)   Resp 16   LMP  11/10/2023 (Exact Date)   SpO2 100%   Plan: Discharge to home.  Prescriptions written for: None  Other home care instructions discussed: Maintain good hydration, monitor symptoms, follow-up as planned  ED return instructions discussed: Chest pain, shortness of breath, new or worsening symptoms, syncope.  Follow-up instructions discussed: Patient encouraged to follow-up with their specialist as planned.  12:47 PM just prior to discharge, patient found to have abrupt change in heart rate, no 160s, showing SVT.  Valsalva performed at bedside with resolution.  Patient feels jittery.  No loss of consciousness.  No chest pain.  Given this, we will check labs.  Patient discussed with/seen by Dr. Pamella.  Reviewed tele.   ED ECG REPORT   Date: 12/14/2023  Rate: 149  Rhythm: atrial flutter  QRS Axis: normal  Intervals: normal  ST/T Wave abnormalities: normal  Conduction Disutrbances:none  Narrative Interpretation: no signs of Brugada, prolonged QTc, WPW, question aflutter vs svt  Old EKG Reviewed: changes noted  I have personally reviewed the EKG tracing and agree with the computerized printout as noted.   2:24 PM Reassessment performed. Patient appears stable. Feels more relaxed after PO ativan .  Would avoid CCB or beta-blocker at this time given reported recent bradycardia down to around 40 at nighttime.  Will give short course of Ativan  to use as needed until she can follow-up with cardiology.  Labs personally reviewed and interpreted including: CBC unremarkable; BMP potassium minimally low at 3.4 otherwise normal electrolytes, kidney function, normal magnesium .  Reviewed pertinent lab work and imaging with patient at bedside. Questions answered.   Most current vital signs reviewed and are as follows: BP 113/65   Pulse 82   Temp 98 F (36.7 C) (Oral)   Resp 20   LMP 11/10/2023 (Exact Date)   SpO2 99%   Plan: Discharge to home.   Prescriptions written for: Ativan  1 mg, # 10  tablets  Other home care instructions discussed: Rest, avoid strenuous activity  ED return instructions discussed: New or worsening symptoms, recurrent tachypalpitations, lightheadedness, shortness of breath, syncope.  Follow-up instructions discussed: Patient encouraged to follow-up with their cardiology as planned.  I have placed ambulatory referral to cardiology requesting follow-up given new tachyarrhythmia today while in the emergency department.                                    Medical Decision Making  Patient presented today initially for evaluation of elevated heart rate with position change.  Patient not appreciably orthostatic here.  She appears well-hydrated.  Just prior to initial discharge, patient had a tachyarrhythmia question SVT versus a flutter.  This did resolve with Valsalva maneuver.  Patient was very anxious afterwards, treated with Ativan  with good results.  Labs were checked, no significant electrolyte abnormality or other concerning finding.  Patient has appropriate cardiology follow-up.  She has a Zio patch in place.  She will need follow-up.  Given clinical improvement, no additional arrhythmia, plan for discharge.  The patient's vital signs, pertinent lab work and imaging were reviewed and interpreted as discussed in the ED course. Hospitalization was considered for further testing, treatments, or serial exams/observation. However as patient is well-appearing, has a stable exam, and reassuring studies today, I do not feel that they warrant admission at this time. This plan was discussed with the patient who verbalizes agreement and comfort with this plan and seems reliable and able to return to the Emergency Department with worsening or changing symptoms.       Final  diagnoses:  Acute nonintractable headache, unspecified headache type  Palpitations  SVT (supraventricular tachycardia) (HCC)  Second degree AV block, Mobitz type I    ED Discharge Orders           Ordered    LORazepam  (ATIVAN ) 1 MG tablet  3 times daily PRN        12/14/23 1420    Ambulatory referral to Cardiology       Comments: If you have not heard from the Cardiology office within the next 72 hours please call [phone].   12/14/23 1422               Desiderio Chew, PA-C 12/14/23 1429    Pamella Ozell LABOR, DO 12/15/23 [phone]

## 2023-12-14 NOTE — Discharge Instructions (Addendum)
 Your labs today were reassuring.  You did have a fast heart rhythm, likely supraventricular tachycardia, while in the emergency department today.  I have prescribed some Ativan  to use as needed for anxiety.  Your cardiologist will help decide if you need to be on any medications to control your heart rhythm.  Ativan  can make you sleepy so do not drive or do any dangerous activities while taking this medication.  Your echocardiogram yesterday appeared reassuring.  I encourage you to follow-up with your cardiologist for further recommendations and to see the neurologist when able.  Come back if you have chest pain, shortness of breath, spells of passing out, new or worsening symptoms or other concerns.

## 2023-12-14 NOTE — ED Notes (Addendum)
 Reviewed discharge instructions, medications, and home care with pt. Pt verbalized understanding and had no further questions. Pt exited ED without complications.

## 2023-12-14 NOTE — ED Triage Notes (Signed)
 C/o recurrents headaches x 1 month with neck pain. Has appt with neurologist on 9/10.   Also states HR jumps up to close to 100 bmp when standing and taking

## 2023-12-14 NOTE — Telephone Encounter (Cosign Needed)
 Asked to resend rx due to missing ICD code [facility]

## 2023-12-15 ENCOUNTER — Telehealth (HOSPITAL_BASED_OUTPATIENT_CLINIC_OR_DEPARTMENT_OTHER): Payer: Self-pay | Admitting: Emergency Medicine

## 2023-12-15 ENCOUNTER — Telehealth (HOSPITAL_BASED_OUTPATIENT_CLINIC_OR_DEPARTMENT_OTHER): Payer: Self-pay

## 2023-12-15 ENCOUNTER — Ambulatory Visit: Payer: Self-pay | Admitting: Cardiology

## 2023-12-15 MED ORDER — LORAZEPAM 1 MG PO TABS: 1.0000 mg | ORAL_TABLET | Freq: Three times a day (TID) | ORAL | 0 refills | Status: AC | PRN

## 2023-12-15 NOTE — Telephone Encounter (Cosign Needed)
 Patient was seen yesterday send and medication but pharmacy did not have it available.  Requesting updated prescription.

## 2023-12-16 ENCOUNTER — Encounter: Payer: Self-pay | Admitting: Cardiology

## 2023-12-16 ENCOUNTER — Ambulatory Visit: Payer: MEDICAID | Attending: Cardiology | Admitting: Cardiology

## 2023-12-16 VITALS — BP 124/68 | HR 91 | Ht 64.0 in | Wt 142.0 lb

## 2023-12-16 DIAGNOSIS — K219 Gastro-esophageal reflux disease without esophagitis: Secondary | ICD-10-CM | POA: Diagnosis not present

## 2023-12-16 DIAGNOSIS — I441 Atrioventricular block, second degree: Secondary | ICD-10-CM | POA: Insufficient documentation

## 2023-12-16 DIAGNOSIS — Z87898 Personal history of other specified conditions: Secondary | ICD-10-CM | POA: Diagnosis present

## 2023-12-16 MED ORDER — METOPROLOL TARTRATE 25 MG PO TABS
12.5000 mg | ORAL_TABLET | Freq: Two times a day (BID) | ORAL | 3 refills | Status: AC
Start: 2023-12-16 — End: 2024-03-15

## 2023-12-16 NOTE — Addendum Note (Signed)
 Addended by: ARLOA PLANAS D on: 12/16/2023 02:42 PM   Modules accepted: Orders

## 2023-12-16 NOTE — Progress Notes (Signed)
 Cardiology Office Note:    Date:  12/16/2023   ID:  Cashae Weich, DOB 05-29-1995, MRN [id number]  PCP:  Anita Bernardino BROCKS, FNP  Cardiologist:  Lamar Fitch, MD    Referring MD: Desiderio Chew, PA-C   No chief complaint on file.   History of Present Illness:    Kathleen Swanson is a 28 y.o. female with past medical history significant for palpitations, tachycardia interestingly she also have documented second-degree type I AV block that was documented about 2 years ago when she was bradycardic and that happened early in the morning hours.  However we have completely opposite problem right now she does have persistent tachycardia, she ended up being in the emergency room recently because of that complaint she also complained of having migraine headache.  She does have appoint with neurologist.  She is very stressed out about this and she is very disappointed takes forever to have that done.  She did have echocardiogram done which was normal.  Past Medical History:  Diagnosis Date   ADHD (attention deficit hyperactivity disorder) 12/06/2023   Anemia 07/08/2013   06/27/13 - discussed the importance of follow through.  She denies any heavy menses and states that she feel well.  I have told her that I will refer her for further evaluation of this anemia.    03/20/14 - notes increased heaviness with her menses.     Anxiety    Anxiety state 02/10/2011   02/02/11 - seen in the ED and evaluated.  Lab work up neg.  States that she is very stressed over the situation in her family and interactions with certain family members.   03/06/11 - seen in the ed on 03/04/11 - started on valium 5mg  - prn anxiety.    04/07/11 - doing well - in counseling - has not taken any valium recently.  Has been seen by dr. Alexandra and will start on 12.5mg  daily.  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  No nausea or vomiting.  04/07/11 - minimal GERD  05/25/12 - stable - gerd.  11/17/12 - brief flare - 2 months ago - lasted briefly - responded to water - triggered by diet.  06/27/13 - no complaints - doing well     History of palpitations 08/06/2023   Migraine    Migraine without status migrainosus, not intractable 08/06/2023    Past Surgical History:  Procedure Laterality Date   ADENOIDECTOMY     BREAST LUMPECTOMY Right 09/04/2022   Procedure: RIGHT BREAST LUMPECTOMY;  Surgeon: Curvin Deward MOULD, MD;  Location: [facility];  Service: General;  Laterality: Right;   CESAREAN SECTION     TUBAL LIGATION     WISDOM TOOTH EXTRACTION      Current Medications: Current Meds  Medication Sig   Cyanocobalamin (B-12 PO) Take by mouth. (Patient taking differently: Take 1,000 mcg by mouth daily at 2 am.)   Ferrous Sulfate (IRON PO) Take by mouth. (Patient taking differently: Take 25 mg by mouth daily at 2 am.)   hydrOXYzine  (VISTARIL ) 25 MG capsule Take 25 mg by mouth every 8 (eight) hours as needed for anxiety.   LORazepam  (ATIVAN ) 1 MG tablet Take 1 tablet (1 mg total) by mouth 3 (three) times daily as needed for anxiety.   Magnesium  200  MG TABS Take 200 mg by mouth daily.   ondansetron  (ZOFRAN -ODT) 4 MG disintegrating tablet Take 1 tablet (4 mg total) by mouth every 8 (eight) hours as needed.   sucralfate (CARAFATE) 1 g tablet Take 1 g by mouth 4 (four) times daily.   valACYclovir (VALTREX) 500 MG tablet Take 500 mg by mouth daily as needed (outbreaks).     Allergies:   Metronidazole and Meloxicam   Social History   Socioeconomic History   Marital status: Divorced    Spouse name: Not on file   Number of children: Not on file   Years of education: Not on file   Highest education level: Not on file  Occupational History   Not on file  Tobacco Use   Smoking status: Never    Passive exposure: Never    Smokeless tobacco: Never  Vaping Use   Vaping status: Never Used  Substance and Sexual Activity   Alcohol use: No   Drug use: No   Sexual activity: Yes    Partners: Male    Birth control/protection: Surgical    Comment: tubal, menarche 28yo, sexual debut 28yo  Other Topics Concern   Not on file  Social History Narrative   Not on file   Social Drivers of Health   Financial Resource Strain: Low Risk  (06/04/2020)   Received from [facility]   Overall Financial Resource Strain (CARDIA)    Difficulty of Paying Living Expenses: Not hard at all  Food Insecurity: Low Risk  (11/22/2023)   Received from [facility]   Hunger Vital Sign    Within the past 12 months, you worried that your food would run out before you got money to buy more: Never true    Within the past 12 months, the food you bought just didn't last and you didn't have money to get more. : Never true  Transportation Needs: No Transportation Needs (11/22/2023)   Received from [facility]    In the past 12 months, has lack of reliable transportation kept you from medical appointments, meetings, work or from getting things needed for daily living? : No  Physical Activity: Sufficiently Active (06/04/2020)   Received from [facility]   Exercise Vital Sign    On average, how many days per week do you engage in moderate to strenuous exercise (like a brisk walk)?: 5 days    On average, how many minutes do you engage in exercise at this level?: 30 min  Stress: No Stress Concern Present (06/04/2020)   Received from [facility]   [employer] of Occupational Health - Occupational Stress Questionnaire    Feeling of Stress : Only a little  Social Connections: Unknown (09/01/2021)   Received [facility]   Social Network    Social Network: Not on file     Family History: The patient's family history includes Breast cancer in her maternal  grandmother; Fibromyalgia in her mother; Hyperlipidemia in her mother; Hypertension in her mother; Multiple sclerosis in her sister. ROS:   Please see the history of present illness.    All 14 point review of systems negative except as described per history of present illness  EKGs/Labs/Other Studies Reviewed:         Recent Labs: 04/07/2023: ALT 8; TSH 1.160 12/14/2023: BUN 9; Creatinine, Ser 0.77; Hemoglobin 12.6; Magnesium  2.0; Platelets 410; Potassium 3.4; Sodium 136  Recent Lipid Panel No results found for: CHOL,  TRIG, HDL, CHOLHDL, VLDL, LDLCALC, LDLDIRECT  Physical Exam:    VS:  BP 124/68   Pulse 91   Ht 5' 4 (1.626 m)   Wt 142 lb 0.6 oz (64.4 kg)   LMP 12/13/2023 (Exact Date)   SpO2 99%   BMI 24.38 kg/m     Wt Readings from Last 3 Encounters:  12/16/23 142 lb 0.6 oz (64.4 kg)  12/06/23 146 lb (66.2 kg)  12/01/23 150 lb (68 kg)     GEN:  Well nourished, well developed in no acute distress HEENT: Normal NECK: No JVD; No carotid bruits LYMPHATICS: No lymphadenopathy CARDIAC: RRR, no murmurs, no rubs, no gallops RESPIRATORY:  Clear to auscultation without rales, wheezing or rhonchi  ABDOMEN: Soft, non-tender, non-distended MUSCULOSKELETAL:  No edema; No deformity  SKIN: Warm and dry LOWER EXTREMITIES: no swelling NEUROLOGIC:  Alert and oriented x 3 PSYCHIATRIC:  Normal affect   ASSESSMENT:    1. Second degree AV block, Mobitz type I   2. Gastroesophageal reflux disease, unspecified whether esophagitis present   3. History of palpitations    PLAN:    In order of problems listed above:  Second-degree type I AV block noted 2 years ago, she is wearing monitor right now.  I think her symptomatology today is opposite the issue I think she simply is very anxious which contribute to her symptomatology.  And I think small dose of beta-blocker may be helpful.  She is still wearing Zio patch asked her to start taking 12.5 mg metoprolol  tartrate twice  daily at the same time she will wear a monitor and 24 hours later she will [place] from mail it to us  this way with no if she get any significant bradycardia while taking that medication I hope blockade of beta-blocker will help with her tachycardia, palpitation as well has some anxiety.  Monitor also allow me to see if we will be able to increase the dose of this medication. Gastroesophageal reflux disease.  Present. I reviewed the emergency room record for this visit.   Medication Adjustments/Labs and Tests Ordered: Current medicines are reviewed at length with the patient today.  Concerns regarding medicines are outlined above.  No orders of the defined types were placed in this encounter.  Medication changes: No orders of the defined types were placed in this encounter.   Signed, Lamar DOROTHA Fitch, MD, [facility] 12/16/2023 2:29 PM    [facility]

## 2023-12-16 NOTE — Patient Instructions (Signed)
 Medication Instructions:   START: Metoprolol  Tartrate 12.5mg  1 tablet twice daily   Lab Work: None Ordered If you have labs (blood work) drawn today and your tests are completely normal, you will receive your results only by: MyChart Message (if you have MyChart) OR A paper copy in the mail If you have any lab test that is abnormal or we need to change your treatment, we will call you to review the results.   Testing/Procedures: None Ordered   Follow-Up: At [facility], you and your health needs are our priority.  As part of our continuing mission to provide you with exceptional heart care, we have created designated Provider Care Teams.  These Care Teams include your primary Cardiologist (physician) and Advanced Practice Providers (APPs -  Physician Assistants and Nurse Practitioners) who all work together to provide you with the care you need, when you need it.  We recommend signing up for the patient portal called MyChart.  Sign up information is provided on this After Visit Summary.  MyChart is used to connect with patients for Virtual Visits (Telemedicine).  Patients are able to view lab/test results, encounter notes, upcoming appointments, etc.  Non-urgent messages can be sent to your provider as well.   To learn more about what you can do with MyChart, go to [email or web address].    Your next appointment:   1 month(s)  The format for your next appointment:   In Person  Provider:   Lamar Fitch, MD    Other Instructions NA

## 2023-12-21 ENCOUNTER — Telehealth: Payer: Self-pay

## 2023-12-21 NOTE — Telephone Encounter (Signed)
 Left message on My Chart with Echo results per Dr. Karry note. Routed to PCP.

## 2024-01-03 ENCOUNTER — Telehealth: Payer: Self-pay | Admitting: Cardiology

## 2024-01-03 DIAGNOSIS — Z87898 Personal history of other specified conditions: Secondary | ICD-10-CM

## 2024-01-03 NOTE — Telephone Encounter (Signed)
 Pt c/o Shortness Of Breath: STAT if SOB developed within the last 24 hours or pt is noticeably SOB on the phone  1. Are you currently SOB (can you hear that pt is SOB on the phone)? No   2. How long have you been experiencing SOB? Few weeks   3. Are you SOB when sitting or when up moving around? Moving around   4. Are you currently experiencing any other symptoms? Anxiety, elevated HR when she walks around    Pt called in asking if she can have a stress test ordered. She states she has anxiety at times and when she gets up and moves around, her hr goes higher. Please advise.

## 2024-01-03 NOTE — Telephone Encounter (Signed)
 EKG Stress test per Dr. Karry note

## 2024-01-07 ENCOUNTER — Encounter (HOSPITAL_COMMUNITY): Payer: Self-pay

## 2024-01-07 ENCOUNTER — Encounter (HOSPITAL_COMMUNITY): Payer: Self-pay | Admitting: *Deleted

## 2024-01-11 DIAGNOSIS — R001 Bradycardia, unspecified: Secondary | ICD-10-CM

## 2024-01-13 ENCOUNTER — Ambulatory Visit (HOSPITAL_COMMUNITY)
Admission: RE | Admit: 2024-01-13 | Discharge: 2024-01-13 | Disposition: A | Discharge status: Home / Self Care | Payer: MEDICAID | Source: Ambulatory Visit | Attending: Cardiology | Admitting: Cardiology

## 2024-01-13 DIAGNOSIS — Z87898 Personal history of other specified conditions: Secondary | ICD-10-CM | POA: Diagnosis not present

## 2024-01-13 DIAGNOSIS — I441 Atrioventricular block, second degree: Secondary | ICD-10-CM | POA: Diagnosis not present

## 2024-01-13 LAB — EXERCISE TOLERANCE TEST
Angina Index: 0
Duke Treadmill Score: 9
Estimated workload: 10.1
Exercise duration (min): 9 min
Exercise duration (sec): 0 s
MPHR: 191 {beats}/min
Peak HR: 169 {beats}/min
Percent HR: 88 %
Rest HR: 84 {beats}/min
ST Depression (mm): 0 mm

## 2024-01-19 ENCOUNTER — Ambulatory Visit: Payer: Self-pay | Admitting: Cardiology

## 2024-01-20 ENCOUNTER — Ambulatory Visit: Payer: MEDICAID | Admitting: Cardiology

## 2024-01-31 ENCOUNTER — Telehealth: Payer: Self-pay

## 2024-01-31 NOTE — Telephone Encounter (Signed)
 Left message on My Chart with monitor results per Dr. Karry note. Routed to PCP

## 2024-01-31 NOTE — Telephone Encounter (Signed)
 Left message on My Chart with stress test results per Dr. Tonja Fray note. Routed to PCP.

## 2024-02-01 ENCOUNTER — Telehealth: Payer: Self-pay

## 2024-02-01 NOTE — Telephone Encounter (Signed)
 Pt viewed stress test results on My Chart per Dr. Karry note. Routed to PCP.

## 2024-02-01 NOTE — Telephone Encounter (Signed)
 Pt viewed monitor results on My Chart per Dr. Vanetta Shawl note. Routed to PCP.

## 2024-02-17 ENCOUNTER — Ambulatory Visit: Payer: MEDICAID | Admitting: Cardiology

## 2024-02-22 ENCOUNTER — Emergency Department (HOSPITAL_BASED_OUTPATIENT_CLINIC_OR_DEPARTMENT_OTHER)
Admission: EM | Admit: 2024-02-22 | Discharge: 2024-02-22 | Disposition: A | Discharge status: Home / Self Care | Payer: MEDICAID | Attending: Emergency Medicine | Admitting: Emergency Medicine

## 2024-02-22 ENCOUNTER — Encounter (HOSPITAL_BASED_OUTPATIENT_CLINIC_OR_DEPARTMENT_OTHER): Payer: Self-pay | Admitting: Emergency Medicine

## 2024-02-22 ENCOUNTER — Other Ambulatory Visit: Payer: Self-pay

## 2024-02-22 DIAGNOSIS — R112 Nausea with vomiting, unspecified: Secondary | ICD-10-CM | POA: Diagnosis not present

## 2024-02-22 DIAGNOSIS — R519 Headache, unspecified: Secondary | ICD-10-CM | POA: Insufficient documentation

## 2024-02-22 DIAGNOSIS — R5383 Other fatigue: Secondary | ICD-10-CM | POA: Diagnosis not present

## 2024-02-22 LAB — COMPREHENSIVE METABOLIC PANEL WITH GFR
ALT: 7 U/L (ref 0–44)
AST: 12 U/L — ABNORMAL LOW (ref 15–41)
Albumin: 4.6 g/dL (ref 3.5–5.0)
Alkaline Phosphatase: 88 U/L (ref 38–126)
Anion gap: 10 (ref 5–15)
BUN: 9 mg/dL (ref 6–20)
CO2: 25 mmol/L (ref 22–32)
Calcium: 9.9 mg/dL (ref 8.9–10.3)
Chloride: 102 mmol/L (ref 98–111)
Creatinine, Ser: 0.83 mg/dL (ref 0.44–1.00)
GFR, Estimated: >60 mL/min (ref >60)
Glucose, Bld: 95 mg/dL (ref 70–99)
Potassium: 4.3 mmol/L (ref 3.5–5.1)
Sodium: 136 mmol/L (ref 135–145)
Total Bilirubin: 0.5 mg/dL (ref 0.0–1.2)
Total Protein: 7.4 g/dL (ref 6.5–8.1)

## 2024-02-22 LAB — URINALYSIS, ROUTINE W REFLEX MICROSCOPIC
Bacteria, UA: NONE SEEN
Bilirubin Urine: NEGATIVE
Glucose, UA: NEGATIVE mg/dL
Ketones, ur: NEGATIVE mg/dL
Leukocytes,Ua: NEGATIVE
Nitrite: NEGATIVE
Protein, ur: NEGATIVE mg/dL
Specific Gravity, Urine: 1.017 (ref 1.005–1.030)
pH: 7 (ref 5.0–8.0)

## 2024-02-22 LAB — CBC
HCT: 38.3 % (ref 36.0–46.0)
Hemoglobin: 12.9 g/dL (ref 12.0–15.0)
MCH: 29.5 pg (ref 26.0–34.0)
MCHC: 33.7 g/dL (ref 30.0–36.0)
MCV: 87.6 fL (ref 80.0–100.0)
Platelets: 394 K/uL (ref 150–400)
RBC: 4.37 MIL/uL (ref 3.87–5.11)
RDW: 12.8 % (ref 11.5–15.5)
WBC: 8.9 K/uL (ref 4.0–10.5)
nRBC: 0 % (ref 0.0–0.2)

## 2024-02-22 LAB — PREGNANCY, URINE: Preg Test, Ur: NEGATIVE

## 2024-02-22 LAB — LIPASE, BLOOD: Lipase: 39 U/L (ref 11–51)

## 2024-02-22 MED ORDER — DEXAMETHASONE SOD PHOSPHATE PF 10 MG/ML IJ SOLN
10.0000 mg | Freq: Once | INTRAMUSCULAR | Status: AC
  Administered 2024-02-22: 10 mg via INTRAVENOUS

## 2024-02-22 MED ORDER — METOCLOPRAMIDE HCL 5 MG/ML IJ SOLN
10.0000 mg | Freq: Once | INTRAMUSCULAR | Status: DC
  Filled 2024-02-22: qty 2

## 2024-02-22 MED ORDER — MAGNESIUM SULFATE IN D5W 1-5 GM/100ML-% IV SOLN
1.0000 g | Freq: Once | INTRAVENOUS | Status: AC
  Administered 2024-02-22: 1 g via INTRAVENOUS
  Filled 2024-02-22: qty 100

## 2024-02-22 MED ORDER — SODIUM CHLORIDE 0.9 % IV BOLUS
500.0000 mL | Freq: Once | INTRAVENOUS | Status: AC
  Administered 2024-02-22: 500 mL via INTRAVENOUS

## 2024-02-22 NOTE — Discharge Instructions (Signed)
 You were seen for a headache.  Please return to the ED if you have double vision, uncontrollable vomiting, or change in mental status.  Thank you for letting us  treat you today. After performing a physical exam, I feel you are safe to go home. Please follow up with your PCP in the next several days and provide them with your records from this visit. Return to the Emergency Room if pain becomes severe or symptoms worsen.

## 2024-02-22 NOTE — ED Provider Notes (Signed)
 [place] EMERGENCY DEPARTMENT AT [facility] Provider Note   CSN: [id number] Arrival date & time: 02/22/24  1524     Patient presents with: Headache and Emesis   Kathleen Swanson is a 28 y.o. female past medical history significant for anxiety, migraine, and GERD presents today for headache with nausea and vomiting and generalized fatigue.  Patient reports that she does not normally eat pork and today had some pork bacon just prior to symptom onset.  Patient reports that this feels like her usual migraine.  Patient denies diplopia, tinnitus, numbness, weakness, fever, chills, shortness of breath, photophobia, phonophobia or chest pain.    Headache Associated symptoms: nausea and vomiting   Emesis Associated symptoms: headaches        Prior to Admission medications   Medication Sig Start Date End Date Taking? Authorizing Provider  Cyanocobalamin (B-12 PO) Take by mouth. Patient taking differently: Take 1,000 mcg by mouth daily at 2 am.    [provider]  Ferrous Sulfate (IRON PO) Take by mouth. Patient taking differently: Take 25 mg by mouth daily at 2 am.    [provider]  hydrOXYzine  (VISTARIL ) 25 MG capsule Take 25 mg by mouth every 8 (eight) hours as needed for anxiety. 08/19/22   [provider]  LORazepam  (ATIVAN ) 1 MG tablet Take 1 tablet (1 mg total) by mouth 3 (three) times daily as needed for anxiety. 12/15/23   [name], PA  Magnesium  200 MG TABS Take 200 mg by mouth daily.    [provider]  metoprolol  tartrate (LOPRESSOR ) 25 MG tablet Take 0.5 tablets (12.5 mg total) by mouth 2 (two) times daily. 12/16/23 03/15/24  Krasowski, Robert J, MD  ondansetron  (ZOFRAN -ODT) 4 MG disintegrating tablet Take 1 tablet (4 mg total) by mouth every 8 (eight) hours as needed. 11/29/23   [name], PA  sucralfate (CARAFATE) 1 g tablet Take 1 g by mouth 4 (four) times daily. 11/22/23 12/16/23  [provider]  valACYclovir  (VALTREX) 500 MG tablet Take 500 mg by mouth daily as needed (outbreaks).    [provider]    Allergies: Metronidazole and Meloxicam    Review of Systems  Gastrointestinal:  Positive for nausea and vomiting.  Neurological:  Positive for headaches.    Updated Vital Signs BP 110/65   Pulse 62   Temp 97.9 F (36.6 C)   Resp 16   Wt 63.5 kg   LMP 02/01/2024   SpO2 100%   BMI 24.03 kg/m   Physical Exam Vitals and nursing note reviewed.  Constitutional:      General: She is not in acute distress.    Appearance: She is well-developed. She is not toxic-appearing.  HENT:     Head: Normocephalic and atraumatic.     Mouth/Throat:     Mouth: Mucous membranes are moist.     Pharynx: Oropharynx is clear.  Eyes:     Extraocular Movements: Extraocular movements intact.     Right eye: Normal extraocular motion.     Left eye: Normal extraocular motion.     Conjunctiva/sclera: Conjunctivae normal.     Pupils: Pupils are equal, round, and reactive to light.  Cardiovascular:     Rate and Rhythm: Normal rate and regular rhythm.     Heart sounds: Normal heart sounds. No murmur heard. Pulmonary:     Effort: Pulmonary effort is normal. No respiratory distress.     Breath sounds: Normal breath sounds.  Abdominal:  Palpations: Abdomen is soft.     Tenderness: There is no abdominal tenderness.  Musculoskeletal:        General: No swelling.     Cervical back: Neck supple.  Skin:    General: Skin is warm and dry.     Capillary Refill: Capillary refill takes less than 2 seconds.  Neurological:     Mental Status: She is alert.     GCS: GCS eye subscore is 4. GCS verbal subscore is 5. GCS motor subscore is 6.  Psychiatric:        Mood and Affect: Mood normal.     (all labs ordered are listed, but only abnormal results are displayed) Labs Reviewed  COMPREHENSIVE METABOLIC PANEL WITH GFR - Abnormal; Notable for the following components:      Result Value   AST 12 (*)     All other components within normal limits  URINALYSIS, ROUTINE W REFLEX MICROSCOPIC - Abnormal; Notable for the following components:   Color, Urine COLORLESS (*)    Hgb urine dipstick TRACE (*)    All other components within normal limits  LIPASE, BLOOD  CBC  PREGNANCY, URINE    EKG: None  Radiology: No results found.   Procedures   Medications Ordered in the ED  metoCLOPramide  (REGLAN ) injection 10 mg (10 mg Intravenous Patient Refused/Not Given 02/22/24 2149)  dexamethasone  (DECADRON ) injection 10 mg (10 mg Intravenous Given 02/22/24 2149)  magnesium  sulfate IVPB 1 g 100 mL (0 g Intravenous Stopped 02/22/24 2301)  sodium chloride  0.9 % bolus 500 mL (500 mLs Intravenous New Bag/Given 02/22/24 2201)                                    Medical Decision Making Amount and/or Complexity of Data Reviewed Labs: ordered.  Risk Prescription drug management.   This patient presents to the ED for concern of headache differential diagnosis includes headache, brain bleed, hypertension, hypotension    Additional history obtained   Additional history obtained from Electronic Medical Record External records from outside source obtained and reviewed including Care Everywhere   Medicines ordered and prescription drug management:  I ordered medication including migraine cocktail    I have reviewed the patients home medicines and have made adjustments as needed   Problem List / ED Course:  Considered CT head, however using shared decision making we have decided to forego imaging at this time as patient has a history of migraines and states that this feels like her usual migraine. Upon reassessment after migraine cocktail, patient states that all of her symptoms have resolved.  Patient tolerating p.o. intake and requesting discharge at this time Considered for admission or further workup however patient's vital signs and physical exam are reassuring.  Patient's symptoms have  resolved at this time.  Patient given return precautions.  I feel patient is safe for discharge at this time.     Final diagnoses:  Acute nonintractable headache, unspecified headache type    ED Discharge Orders     None          Francis Ileana LOISE DEVONNA 02/22/24 2308    Dean Clarity, MD 02/22/24 [phone]

## 2024-02-22 NOTE — ED Notes (Signed)
 Reviewed discharge instructions and follow-up care with pt. Pt verbalized understanding and had no further questions. Pt exited ED without complications.

## 2024-02-22 NOTE — ED Triage Notes (Signed)
 Pt endorses HA, n/v and weakness after eating pork today

## 2024-03-22 ENCOUNTER — Ambulatory Visit: Payer: MEDICAID | Admitting: Obstetrics and Gynecology

## 2024-04-06 NOTE — Progress Notes (Addendum)
 [name]  [birth year]  ED fu with pt, she noted HA today is bearable. Enc otc agents to help with sym and good hydration. She noted she has moved back to the area and has fu with L. Abbott but not until March. She noted while in [place] she was going to neurology but they were trying her on a lot of diff meds that were not helping. Contacted clinical staff / [facility] to fu pt and further advise to get through the holiday, PMD will be out for a few days. REf to B. Brittt RN HC to fu and discuss ED mgt with pt, identified FF with 9 visits/ 6 months.   Santa Pizza, LPN Post ED Visit Navigator - Transitional Care [facility] [place] Cell # [phone] [email or web address] (link: [email or web address])

## 2024-04-12 ENCOUNTER — Encounter (HOSPITAL_BASED_OUTPATIENT_CLINIC_OR_DEPARTMENT_OTHER): Payer: Self-pay

## 2024-04-12 ENCOUNTER — Emergency Department (HOSPITAL_BASED_OUTPATIENT_CLINIC_OR_DEPARTMENT_OTHER)
Admission: EM | Admit: 2024-04-12 | Discharge: 2024-04-12 | Disposition: A | Discharge status: Home / Self Care | Payer: MEDICAID | Attending: Emergency Medicine | Admitting: Emergency Medicine

## 2024-04-12 ENCOUNTER — Other Ambulatory Visit: Payer: Self-pay

## 2024-04-12 DIAGNOSIS — J069 Acute upper respiratory infection, unspecified: Secondary | ICD-10-CM | POA: Diagnosis not present

## 2024-04-12 DIAGNOSIS — B9789 Other viral agents as the cause of diseases classified elsewhere: Secondary | ICD-10-CM | POA: Insufficient documentation

## 2024-04-12 DIAGNOSIS — G43709 Chronic migraine without aura, not intractable, without status migrainosus: Secondary | ICD-10-CM | POA: Diagnosis not present

## 2024-04-12 DIAGNOSIS — R519 Headache, unspecified: Secondary | ICD-10-CM | POA: Diagnosis present

## 2024-04-12 LAB — RESP PANEL BY RT-PCR (RSV, FLU A&B, COVID)  RVPGX2
Influenza A by PCR: NEGATIVE
Influenza B by PCR: NEGATIVE
Resp Syncytial Virus by PCR: NEGATIVE
SARS Coronavirus 2 by RT PCR: NEGATIVE

## 2024-04-12 MED ORDER — SODIUM CHLORIDE 0.9 % IV BOLUS
1000.0000 mL | Freq: Once | INTRAVENOUS | Status: AC
  Administered 2024-04-12: 1000 mL via INTRAVENOUS

## 2024-04-12 MED ORDER — KETOROLAC TROMETHAMINE 30 MG/ML IJ SOLN
15.0000 mg | Freq: Once | INTRAMUSCULAR | Status: AC
  Administered 2024-04-12: 15 mg via INTRAVENOUS
  Filled 2024-04-12: qty 1

## 2024-04-12 MED ORDER — PROCHLORPERAZINE EDISYLATE 10 MG/2ML IJ SOLN
10.0000 mg | Freq: Once | INTRAMUSCULAR | Status: AC
  Administered 2024-04-12: 10 mg via INTRAVENOUS
  Filled 2024-04-12: qty 2

## 2024-04-12 MED ORDER — DEXAMETHASONE SOD PHOSPHATE PF 10 MG/ML IJ SOLN
10.0000 mg | Freq: Once | INTRAMUSCULAR | Status: AC
  Administered 2024-04-12: 10 mg via INTRAVENOUS

## 2024-04-12 NOTE — ED Provider Notes (Signed)
 "  [place] EMERGENCY DEPARTMENT AT [facility]  Provider Note  CSN: [id number] Arrival date & time: 04/12/24 9475  History Chief Complaint  Patient presents with   Headache    Kathleen Swanson is a 28 y.o. female with chronic headaches, frequent ED visits for same, has been followed by Neurology and tried on numerous medications without improvement reports typical headache in the last few days. She has also had cough congestion and racing heart with HR up to 90s at home. Prior history of SVT, followed by cardiology. No fever.    Home Medications Prior to Admission medications  Medication Sig Start Date End Date Taking? Authorizing Provider  Cyanocobalamin (B-12 PO) Take by mouth. Patient taking differently: Take 1,000 mcg by mouth daily at 2 am.    [provider]  Ferrous Sulfate (IRON PO) Take by mouth. Patient taking differently: Take 25 mg by mouth daily at 2 am.    [provider]  hydrOXYzine  (VISTARIL ) 25 MG capsule Take 25 mg by mouth every 8 (eight) hours as needed for anxiety. 08/19/22   [provider]  LORazepam  (ATIVAN ) 1 MG tablet Take 1 tablet (1 mg total) by mouth 3 (three) times daily as needed for anxiety. 12/15/23   Silver Fell A, PA  Magnesium  200 MG TABS Take 200 mg by mouth daily.    [provider]  metoprolol  tartrate (LOPRESSOR ) 25 MG tablet Take 0.5 tablets (12.5 mg total) by mouth 2 (two) times daily. 12/16/23 03/15/24  Krasowski, Robert J, MD  ondansetron  (ZOFRAN -ODT) 4 MG disintegrating tablet Take 1 tablet (4 mg total) by mouth every 8 (eight) hours as needed. 11/29/23   Silver Fell LABOR, PA  sucralfate (CARAFATE) 1 g tablet Take 1 g by mouth 4 (four) times daily. 11/22/23 12/16/23  [provider]  valACYclovir (VALTREX) 500 MG tablet Take 500 mg by mouth daily as needed (outbreaks).    [provider]     Allergies    Metronidazole and Meloxicam   Review of Systems   Review of  Systems Please see HPI for pertinent positives and negatives  Physical Exam BP 116/68 (BP Location: Right Arm)   Pulse 100   Temp 98.5 F (36.9 C) (Oral)   Resp 18   Ht 5' 4 (1.626 m)   Wt 59 kg   LMP 04/05/2024 (Approximate)   SpO2 99%   BMI 22.31 kg/m   Physical Exam Vitals and nursing note reviewed.  Constitutional:      Appearance: Normal appearance.  HENT:     Head: Normocephalic and atraumatic.     Nose: Nose normal.     Mouth/Throat:     Mouth: Mucous membranes are moist.  Eyes:     Extraocular Movements: Extraocular movements intact.     Conjunctiva/sclera: Conjunctivae normal.  Cardiovascular:     Rate and Rhythm: Normal rate.  Pulmonary:     Effort: Pulmonary effort is normal.     Breath sounds: Normal breath sounds.  Abdominal:     General: Abdomen is flat.     Palpations: Abdomen is soft.     Tenderness: There is no abdominal tenderness.  Musculoskeletal:        General: No swelling. Normal range of motion.     Cervical back: Neck supple.  Skin:    General: Skin is warm and dry.  Neurological:     General: No focal deficit present.     Mental Status: She is alert.  Psychiatric:  Mood and Affect: Mood normal.     ED Results / Procedures / Treatments   EKG None  Procedures Procedures  Medications Ordered in the ED Medications  ketorolac  (TORADOL ) 30 MG/ML injection 15 mg (15 mg Intravenous Given 04/12/24 0620)  prochlorperazine  (COMPAZINE ) injection 10 mg (10 mg Intravenous Given 04/12/24 0622)  dexamethasone  (DECADRON ) injection 10 mg (10 mg Intravenous Given 04/12/24 9378)  sodium chloride  0.9 % bolus 1,000 mL (1,000 mLs Intravenous New Bag/Given 04/12/24 0620)    Initial Impression and Plan  Patient well appearing here with viral URI symptoms as well as exacerbation of chronic headache. Will check Covid/Flu/RSV swab, and given headache cocktail/IVF. She has unremarkable vitals and exam, no significant tachycardia, sinus rhythm on  monitor.   ED Course   Clinical Course as of 04/12/24 0645  Wed Apr 12, 2024  0622 Covid/Flu/RSV swab is neg.  [CS]  [phone] Patient feeling better, headache improved and she is ready go to home. Recommend continued outpatient management of her chronic migraines. Supportive care for URI symptoms. PCP follow up, RTED for any other concerns.   [CS]    Clinical Course User Index [CS] Roselyn Carlin NOVAK, MD     MDM Rules/Calculators/A&P Medical Decision Making Problems Addressed: Chronic migraine without aura without status migrainosus, not intractable: chronic illness or injury with exacerbation, progression, or side effects of treatment Viral URI with cough: acute illness or injury  Amount and/or Complexity of Data Reviewed Labs: ordered. Decision-making details documented in ED Course.  Risk Prescription drug management.     Final Clinical Impression(s) / ED Diagnoses Final diagnoses:  Chronic migraine without aura without status migrainosus, not intractable  Viral URI with cough    Rx / DC Orders ED Discharge Orders     None        Roselyn Carlin NOVAK, MD 04/12/24 [phone]  "

## 2024-04-12 NOTE — ED Triage Notes (Signed)
 Pt POV from home c/o cough and congestion w/ headache since yesterday. Also c/o difficulty sleeping, apple watch recording HR in 90s at rest. Denies SOB, CP. Denies fever, NVD.
